# Patient Record
Sex: Male | Born: 1975
Health system: Southern US, Community
[De-identification: ages and names within clinical notes are randomized; demographics above are authoritative.]

## PROBLEM LIST (undated history)

## (undated) DIAGNOSIS — F419 Anxiety disorder, unspecified: Secondary | ICD-10-CM

## (undated) DIAGNOSIS — J189 Pneumonia, unspecified organism: Secondary | ICD-10-CM

## (undated) DIAGNOSIS — F329 Major depressive disorder, single episode, unspecified: Secondary | ICD-10-CM

## (undated) DIAGNOSIS — A6000 Herpesviral infection of urogenital system, unspecified: Secondary | ICD-10-CM

## (undated) DIAGNOSIS — T7840XA Allergy, unspecified, initial encounter: Secondary | ICD-10-CM

## (undated) DIAGNOSIS — B001 Herpesviral vesicular dermatitis: Secondary | ICD-10-CM

## (undated) DIAGNOSIS — K219 Gastro-esophageal reflux disease without esophagitis: Secondary | ICD-10-CM

## (undated) DIAGNOSIS — K649 Unspecified hemorrhoids: Secondary | ICD-10-CM

## (undated) HISTORY — DX: Gastro-esophageal reflux disease without esophagitis: K21.9

## (undated) HISTORY — PX: LACERATION REPAIR: SHX5168

## (undated) HISTORY — DX: Allergy, unspecified, initial encounter: T78.40XA

## (undated) HISTORY — DX: Major depressive disorder, single episode, unspecified: F32.9

## (undated) HISTORY — DX: Herpesviral infection of urogenital system, unspecified: A60.00

## (undated) HISTORY — DX: Unspecified hemorrhoids: K64.9

## (undated) HISTORY — DX: Herpesviral vesicular dermatitis: B00.1

## (undated) HISTORY — DX: Anxiety disorder, unspecified: F41.9

---

## 2011-02-01 ENCOUNTER — Emergency Department (HOSPITAL_COMMUNITY): Payer: 59

## 2011-02-01 ENCOUNTER — Emergency Department (HOSPITAL_COMMUNITY)
Admission: EM | Admit: 2011-02-01 | Discharge: 2011-02-01 | Disposition: A | Discharge status: Home / Self Care | Payer: 59 | Attending: Emergency Medicine | Admitting: Emergency Medicine

## 2011-02-01 DIAGNOSIS — F17200 Nicotine dependence, unspecified, uncomplicated: Secondary | ICD-10-CM | POA: Insufficient documentation

## 2011-02-01 DIAGNOSIS — R6889 Other general symptoms and signs: Secondary | ICD-10-CM | POA: Insufficient documentation

## 2011-02-01 DIAGNOSIS — R0789 Other chest pain: Secondary | ICD-10-CM | POA: Insufficient documentation

## 2011-02-01 DIAGNOSIS — J309 Allergic rhinitis, unspecified: Secondary | ICD-10-CM | POA: Insufficient documentation

## 2011-02-01 LAB — DIFFERENTIAL
Eosinophils Absolute: 0 10*3/uL (ref 0.0–0.7)
Eosinophils Relative: 1 % (ref 0–5)
Lymphocytes Relative: 32 % (ref 12–46)
Lymphs Abs: 1.8 10*3/uL (ref 0.7–4.0)
Monocytes Relative: 10 % (ref 3–12)

## 2011-02-01 LAB — CBC
HCT: 41.5 % (ref 39.0–52.0)
MCH: 28.3 pg (ref 26.0–34.0)
MCV: 80 fL (ref 78.0–100.0)
RBC: 5.19 MIL/uL (ref 4.22–5.81)
WBC: 5.5 10*3/uL (ref 4.0–10.5)

## 2011-02-01 LAB — BASIC METABOLIC PANEL
BUN: 13 mg/dL (ref 6–23)
CO2: 31 mEq/L (ref 19–32)
Calcium: 9.5 mg/dL (ref 8.4–10.5)
Chloride: 104 mEq/L (ref 96–112)
Creatinine, Ser: 0.85 mg/dL (ref 0.50–1.35)

## 2011-02-01 LAB — POCT I-STAT TROPONIN I: Troponin i, poc: 0 ng/mL (ref 0.00–0.08)

## 2011-05-24 HISTORY — PX: COLONOSCOPY: SHX174

## 2011-06-01 ENCOUNTER — Other Ambulatory Visit: Payer: Self-pay | Admitting: Family Medicine

## 2011-06-01 ENCOUNTER — Ambulatory Visit
Admission: RE | Admit: 2011-06-01 | Discharge: 2011-06-01 | Disposition: A | Discharge status: Home / Self Care | Payer: 59 | Source: Ambulatory Visit | Attending: Family Medicine | Admitting: Family Medicine

## 2011-06-01 DIAGNOSIS — R05 Cough: Secondary | ICD-10-CM

## 2012-01-16 ENCOUNTER — Emergency Department (HOSPITAL_COMMUNITY): Payer: No Typology Code available for payment source

## 2012-01-16 ENCOUNTER — Emergency Department (HOSPITAL_COMMUNITY)
Admission: EM | Admit: 2012-01-16 | Discharge: 2012-01-16 | Disposition: A | Discharge status: Home / Self Care | Payer: No Typology Code available for payment source | Attending: Emergency Medicine | Admitting: Emergency Medicine

## 2012-01-16 ENCOUNTER — Encounter (HOSPITAL_COMMUNITY): Payer: Self-pay | Admitting: Emergency Medicine

## 2012-01-16 DIAGNOSIS — Z043 Encounter for examination and observation following other accident: Secondary | ICD-10-CM | POA: Insufficient documentation

## 2012-01-16 DIAGNOSIS — F17200 Nicotine dependence, unspecified, uncomplicated: Secondary | ICD-10-CM | POA: Insufficient documentation

## 2012-01-16 DIAGNOSIS — M549 Dorsalgia, unspecified: Secondary | ICD-10-CM | POA: Insufficient documentation

## 2012-01-16 MED ORDER — OXYCODONE-ACETAMINOPHEN 5-325 MG PO TABS
1.0000 | ORAL_TABLET | Freq: Once | ORAL | Status: AC
  Administered 2012-01-16: 1 via ORAL
  Filled 2012-01-16: qty 1

## 2012-01-16 MED ORDER — OXYCODONE-ACETAMINOPHEN 5-325 MG PO TABS: 1.0000 | ORAL_TABLET | Freq: Once | ORAL | Status: AC

## 2012-01-16 NOTE — ED Notes (Signed)
Patient transported to X-ray 
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
## 2012-01-16 NOTE — ED Notes (Signed)
MD at bedside. 

## 2012-11-02 ENCOUNTER — Emergency Department (HOSPITAL_COMMUNITY): Payer: BC Managed Care – PPO

## 2012-11-02 ENCOUNTER — Emergency Department (HOSPITAL_COMMUNITY)
Admission: EM | Admit: 2012-11-02 | Discharge: 2012-11-02 | Disposition: A | Discharge status: Home / Self Care | Payer: BC Managed Care – PPO | Attending: Emergency Medicine | Admitting: Emergency Medicine

## 2012-11-02 ENCOUNTER — Encounter (HOSPITAL_COMMUNITY): Payer: Self-pay | Admitting: Emergency Medicine

## 2012-11-02 DIAGNOSIS — Y9389 Activity, other specified: Secondary | ICD-10-CM | POA: Insufficient documentation

## 2012-11-02 DIAGNOSIS — S0990XA Unspecified injury of head, initial encounter: Secondary | ICD-10-CM | POA: Insufficient documentation

## 2012-11-02 DIAGNOSIS — S298XXA Other specified injuries of thorax, initial encounter: Secondary | ICD-10-CM | POA: Insufficient documentation

## 2012-11-02 DIAGNOSIS — S02113A Unspecified occipital condyle fracture, initial encounter for closed fracture: Secondary | ICD-10-CM

## 2012-11-02 DIAGNOSIS — S02109A Fracture of base of skull, unspecified side, initial encounter for closed fracture: Secondary | ICD-10-CM | POA: Insufficient documentation

## 2012-11-02 DIAGNOSIS — Y92410 Unspecified street and highway as the place of occurrence of the external cause: Secondary | ICD-10-CM | POA: Insufficient documentation

## 2012-11-02 DIAGNOSIS — S12000A Unspecified displaced fracture of first cervical vertebra, initial encounter for closed fracture: Secondary | ICD-10-CM | POA: Insufficient documentation

## 2012-11-02 DIAGNOSIS — Z79899 Other long term (current) drug therapy: Secondary | ICD-10-CM | POA: Insufficient documentation

## 2012-11-02 DIAGNOSIS — R079 Chest pain, unspecified: Secondary | ICD-10-CM

## 2012-11-02 DIAGNOSIS — IMO0002 Reserved for concepts with insufficient information to code with codable children: Secondary | ICD-10-CM | POA: Insufficient documentation

## 2012-11-02 DIAGNOSIS — S0081XA Abrasion of other part of head, initial encounter: Secondary | ICD-10-CM

## 2012-11-02 MED ORDER — MUPIROCIN CALCIUM 2 % EX CREA: TOPICAL_CREAM | Freq: Three times a day (TID) | CUTANEOUS | Status: DC

## 2012-11-02 MED ORDER — MORPHINE SULFATE 4 MG/ML IJ SOLN
4.0000 mg | Freq: Once | INTRAMUSCULAR | Status: AC
  Administered 2012-11-02: 4 mg via INTRAVENOUS
  Filled 2012-11-02: qty 1

## 2012-11-02 MED ORDER — OXYCODONE-ACETAMINOPHEN 5-325 MG PO TABS: 1.0000 | ORAL_TABLET | ORAL | Status: DC | PRN

## 2012-11-02 MED ORDER — SODIUM CHLORIDE 0.9 % IV BOLUS (SEPSIS)
500.0000 mL | Freq: Once | INTRAVENOUS | Status: AC
  Administered 2012-11-02: 500 mL via INTRAVENOUS
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
## 2012-11-02 NOTE — ED Notes (Signed)
Pt c/o neck and lower chest pain.

## 2012-11-03 NOTE — ED Provider Notes (Signed)
Medical screening examination/treatment/procedure(s) were conducted as a shared visit with non-physician practitioner(s) and myself.  I personally evaluated the patient during the encounter  Doug Sou, MD 11/03/12 0100

## 2014-06-17 LAB — HM COLONOSCOPY

## 2014-06-24 LAB — HM COLONOSCOPY

## 2017-08-07 ENCOUNTER — Encounter: Payer: Self-pay | Admitting: Medical

## 2017-08-07 ENCOUNTER — Ambulatory Visit (INDEPENDENT_AMBULATORY_CARE_PROVIDER_SITE_OTHER): Payer: No Typology Code available for payment source | Admitting: Medical

## 2017-08-07 ENCOUNTER — Telehealth: Payer: Self-pay | Admitting: Medical

## 2017-08-07 VITALS — BP 128/78 | HR 66 | Temp 98.3°F | Ht 73.0 in | Wt 216.8 lb

## 2017-08-07 DIAGNOSIS — K582 Mixed irritable bowel syndrome: Secondary | ICD-10-CM

## 2017-08-07 DIAGNOSIS — F458 Other somatoform disorders: Secondary | ICD-10-CM | POA: Diagnosis not present

## 2017-08-07 DIAGNOSIS — K649 Unspecified hemorrhoids: Secondary | ICD-10-CM | POA: Diagnosis not present

## 2017-08-07 DIAGNOSIS — K219 Gastro-esophageal reflux disease without esophagitis: Secondary | ICD-10-CM

## 2017-08-07 DIAGNOSIS — G8929 Other chronic pain: Secondary | ICD-10-CM | POA: Diagnosis not present

## 2017-08-07 DIAGNOSIS — R109 Unspecified abdominal pain: Secondary | ICD-10-CM

## 2017-08-07 DIAGNOSIS — N529 Male erectile dysfunction, unspecified: Secondary | ICD-10-CM

## 2017-08-07 DIAGNOSIS — R0989 Other specified symptoms and signs involving the circulatory and respiratory systems: Secondary | ICD-10-CM

## 2017-08-07 MED ORDER — ELUXADOLINE 75 MG PO TABS: 1.0000 | ORAL_TABLET | Freq: Two times a day (BID) | ORAL | 0 refills | Status: DC

## 2017-08-07 MED ORDER — OMEPRAZOLE 40 MG PO CPDR: 40.0000 mg | DELAYED_RELEASE_CAPSULE | Freq: Every day | ORAL | 1 refills | Status: DC

## 2017-08-07 NOTE — Progress Notes (Signed)
Subjective: Chief Complaint  Patient presents with  . New Patient (Initial Visit)   Here as a new patient.  Was seeing [facility] prior.    He has several concerns today.  He has a history of erectile dysfunction, takes generic Viagra, prescribed through Dr. Tresa Moore at [facility].  His main concern is bowel issues.  Having some changes in bowels in the last 5- 6 months.  Sometimes goes to sit in bathroom and just a little comes out.  Has to go again in 30 minutes.  Feels constipated at times.   Sometimes goes 4-5 times daily.  Has always had stomach problems.  Drinks a pint of liquor weekly.  Gets a lot bloating and gas.  No prior food allergy testing.  Doesn't tolerate fresh pineapple.  Had EGD and colonoscopy in past 3-4 years ago.  Saw Dr. Paulita Fujita.  Had some colon polyps.    Sometimes "feels weird" in his anterior neck like he is swollen.  Sometimes feels weird in his chest.  Sometimes has sharp pain in his low back.  Has hemorrhoids.  Uses hemorrhoid cream.  In general is a [employer], [employer].  Unemployed since 04/27/18.  hasn't paid off the endoscopy bills.     GERD - at times takes OTC Prilosec. Has been on medication prior for GERD.  Felt like food was coming back up.   Felt sensation of blood in back of mouth.   No physical in 3 years.   His prior PCP at [facility] left.  Wants a physical   Has used Valtrex in past. No outbreaks in many years.   Has his kids every other weekend, and 2 days per week,   Past Medical History:  Diagnosis Date  . Anxiety    no doctor dx, states he feels it.  . Depression    no doctor dx, states he feels it.   Current Outpatient Medications on File Prior to Visit  Medication Sig Dispense Refill  . BLACK CURRANT SEED OIL PO Take 350 mg by mouth.    . sildenafil (REVATIO) 20 MG tablet Take 20 mg by mouth 3 (three) times daily.    Marland Kitchen albuterol (PROVENTIL HFA;VENTOLIN HFA) 108 (90 BASE) MCG/ACT inhaler Inhale 2 puffs into the lungs every 6 (six) hours as  needed for wheezing or shortness of breath.     . cephALEXin (KEFLEX) 500 MG capsule Take 500 mg by mouth 2 (two) times daily. For 10 days; Start date 10/29/12    . mupirocin cream (BACTROBAN) 2 % Apply topically 3 (three) times daily. To abrasions on face (Patient not taking: Reported on 08/07/2017) 15 g 0  . oxyCODONE-acetaminophen (PERCOCET/ROXICET) 5-325 MG per tablet Take 1-2 tablets by mouth every 4 (four) hours as needed for pain. (Patient not taking: Reported on 08/07/2017) 60 tablet 0   No current facility-administered medications on file prior to visit.    ROS as in subjective    Objective: BP 128/78 (BP Location: Right Arm, Patient Position: Sitting, Cuff Size: Normal)   Pulse 66   Temp 98.3 F (36.8 C) (Oral)   Ht 6\' 1"  (1.854 m)   Wt 216 lb 12.8 oz (98.3 kg)   SpO2 98%   BMI 28.60 kg/m   General appearance: alert, no distress, WD/WN, AA male HEENT: normocephalic, sclerae anicteric, TMs pearly, nares patent, no discharge or erythema, pharynx normal Oral cavity: MMM, no lesions Neck: supple, no lymphadenopathy, no thyromegaly, no masses Heart: RRR, normal S1, S2, no murmurs Lungs: CTA bilaterally,  no wheezes, rhonchi, or rales Abdomen: +bs, soft, non tender, non distended, no masses, no hepatomegaly, no splenomegaly Pulses: 2+ symmetric, upper and lower extremities, normal cap refill     Assessment: Encounter Diagnoses  Name Primary?  . Chronic abdominal pain Yes  . Globus sensation   . Gastroesophageal reflux disease without esophagitis   . Hemorrhoids, unspecified hemorrhoid type   . Erectile dysfunction, unspecified erectile dysfunction type   . Irritable bowel syndrome with both constipation and diarrhea     Plan: IBS, abdominal pain, globus sensation, GERD - Begin trial of Viberzi samples for IBS.   Begin back on PPI for GERD.  Keep a food diary, avoid GERD triggers.   Discussed symptoms and diagnosis of IBS.  Will request copy of prior EGD and  Colonoscopy.  ED - c/t Viagra prn.  [facility] was seen today for new patient (initial visit).  Diagnoses and all orders for this visit:  Chronic abdominal pain  Globus sensation  Gastroesophageal reflux disease without esophagitis  Hemorrhoids, unspecified hemorrhoid type  Erectile dysfunction, unspecified erectile dysfunction type  Irritable bowel syndrome with both constipation and diarrhea  Other orders -     Eluxadoline (VIBERZI) 75 MG TABS; Take 1 tablet by mouth 2 (two) times daily. -     omeprazole (PRILOSEC) 40 MG capsule; Take 1 capsule (40 mg total) by mouth daily.  f/u in 3-4 wk for physical

## 2017-08-07 NOTE — Telephone Encounter (Signed)
Patient called because he states that he gave the wrong pharmacy location  He wants to use [facility], (I updated in chart) He states that you were sending in RX for reflux medication But I do not see that anything has been sent in

## 2017-08-16 ENCOUNTER — Telehealth: Payer: Self-pay | Admitting: Medical

## 2017-08-16 NOTE — Telephone Encounter (Signed)
Requested records received from Dr. Paulita Fujita at [facility]. Sending back for review.

## 2017-08-18 ENCOUNTER — Encounter: Payer: Self-pay | Admitting: Medical

## 2017-08-31 ENCOUNTER — Encounter: Payer: No Typology Code available for payment source | Admitting: Medical

## 2017-09-11 ENCOUNTER — Encounter: Payer: Self-pay | Admitting: Medical

## 2017-12-12 ENCOUNTER — Other Ambulatory Visit: Payer: Self-pay

## 2017-12-12 ENCOUNTER — Ambulatory Visit (INDEPENDENT_AMBULATORY_CARE_PROVIDER_SITE_OTHER): Payer: Self-pay | Admitting: Medical

## 2017-12-12 VITALS — BP 110/70 | HR 56 | Temp 97.6°F | Resp 16 | Ht 74.0 in | Wt 207.0 lb

## 2017-12-12 DIAGNOSIS — K582 Mixed irritable bowel syndrome: Secondary | ICD-10-CM

## 2017-12-12 DIAGNOSIS — K219 Gastro-esophageal reflux disease without esophagitis: Secondary | ICD-10-CM

## 2017-12-12 DIAGNOSIS — B001 Herpesviral vesicular dermatitis: Secondary | ICD-10-CM

## 2017-12-12 DIAGNOSIS — N529 Male erectile dysfunction, unspecified: Secondary | ICD-10-CM

## 2017-12-12 MED ORDER — VALACYCLOVIR HCL 500 MG PO TABS: 500.0000 mg | ORAL_TABLET | Freq: Every day | ORAL | 5 refills | Status: DC

## 2017-12-12 MED ORDER — OMEPRAZOLE 40 MG PO CPDR: 40.0000 mg | DELAYED_RELEASE_CAPSULE | Freq: Every day | ORAL | 1 refills | Status: DC

## 2017-12-12 MED ORDER — SILDENAFIL CITRATE 20 MG PO TABS: ORAL_TABLET | ORAL | 2 refills | Status: DC

## 2017-12-12 NOTE — Progress Notes (Signed)
Subjective: Chief Complaint  Patient presents with  . rx refill    pt needs refill, no lproblems with IBS since cutting out red meat,    Needs refill on Valtrex generic.    Has hx/o genital or oral herpetic lesions.  Has had several lesions in recent months.  Has used Valtrex in the past with good response.  Wants prescription for this.   No prior treatment with Acyclovir.     Needs refills on generic Viagra which he takes good response.  Has been on this  years through years  urology would like a refill today to me instead.  Has no side effects with this  Recently has not had much issues in the way of bowels.  In recent months he cut out red meat.  cut out tobacco and marijuana.  Is vaping some.     Past Medical History:  Diagnosis Date  . Anxiety    no doctor dx, states he feels it.  . Depression    no doctor dx, states he feels it.   Current Outpatient Medications on File Prior to Visit  Medication Sig Dispense Refill  . omeprazole (PRILOSEC) 40 MG capsule Take 1 capsule (40 mg total) by mouth daily. 30 capsule 1  . sildenafil (REVATIO) 20 MG tablet Take 20 mg by mouth 3 (three) times daily.    . mupirocin cream (BACTROBAN) 2 % Apply topically 3 (three) times daily. To abrasions on face (Patient not taking: Reported on 08/07/2017) 15 g 0   No current facility-administered medications on file prior to visit.    ROS as in subjective    Objective: BP 110/70   Pulse (!) 56   Temp 97.6 F (36.4 C) (Oral)   Resp 16   Ht 6\' 2"  (1.88 m)   Wt 207 lb (93.9 kg)   SpO2 98%   BMI 26.58 kg/m   General appearance: alert, no distress, WD/WN, AA male Left upper lip with mid stage healing cold sores, multiple otherwise HENT unremarkable    Assessment: Encounter Diagnoses  Name Primary?  . Recurrent cold sores Yes  . Erectile dysfunction, unspecified erectile dysfunction type   . Irritable bowel syndrome with both constipation and diarrhea   . Gastroesophageal reflux disease  without esophagitis     Plan: Recurrent cold sores-begin Viagra for prevention daily, discussed proper use, discussed dosage for flareups.  Discussed hygiene, prevention of spread  ED-does fine on generic Viagra, refill this today, discussed proper use, possible complications  IBS and GERD-no recent problems, controlled with medication and diet.  I reviewed his 2016 EGD and colonoscopy report he will be due for repeat colonoscopy in 2021  Wyeth was seen today for rx refill.  Diagnoses and all orders for this visit:  Recurrent cold sores  Erectile dysfunction, unspecified erectile dysfunction type  Irritable bowel syndrome with both constipation and diarrhea  Gastroesophageal reflux disease without esophagitis  Other orders -     valACYclovir (VALTREX) 500 MG tablet; Take 1 tablet (500 mg total) by mouth daily. Daily for suppression, use 4 tablets BID x 1 day for flare up -     sildenafil (REVATIO) 20 MG tablet; 2 tablets (40 mg) prior to sexual activity daily prn     f/u with call report in a few weeks.

## 2018-02-20 ENCOUNTER — Encounter (HOSPITAL_COMMUNITY): Payer: Self-pay | Admitting: Emergency Medicine

## 2018-02-20 ENCOUNTER — Other Ambulatory Visit: Payer: Self-pay

## 2018-02-20 ENCOUNTER — Ambulatory Visit (HOSPITAL_COMMUNITY)
Admission: EM | Admit: 2018-02-20 | Discharge: 2018-02-20 | Disposition: A | Discharge status: Home / Self Care | Payer: Self-pay | Attending: Family Medicine | Admitting: Family Medicine

## 2018-02-20 DIAGNOSIS — J40 Bronchitis, not specified as acute or chronic: Secondary | ICD-10-CM

## 2018-02-20 MED ORDER — HYDROCODONE-HOMATROPINE 5-1.5 MG/5ML PO SYRP: 5.0000 mL | ORAL_SOLUTION | Freq: Four times a day (QID) | ORAL | 0 refills | Status: DC | PRN

## 2018-02-20 MED ORDER — PREDNISONE 20 MG PO TABS: ORAL_TABLET | ORAL | 0 refills | Status: DC

## 2018-02-20 NOTE — Discharge Instructions (Addendum)
Please return if cough is not improving by Thursday

## 2018-02-20 NOTE — ED Triage Notes (Signed)
Pt reports a cough since Sunday but denies any nasal congestion or fever.

## 2018-02-20 NOTE — ED Provider Notes (Signed)
[facility]    CSN: [id number] Arrival date & time: 02/20/18  1825     History   Chief Complaint Chief Complaint  Patient presents with  . URI    HPI Tabb Croghan is a 42 y.o. male.    Pt reports a cough since Sunday but denies any nasal congestion.  He believes is been running a low-grade fever for the last 24 hours.  Patient has had reactive airways in the past but did not benefit from an inhaler.  Patient notes that he smokes 1 cigarette daily until late last week when he stopped.  He has used electronic cigarettes in the past but stopped a couple months ago.  He stopped taking any "maryjane" last May.  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  His cough is described as productive with some brown mucus periodically.  He has noted no wheezing.       Past Medical History:  Diagnosis Date  . Anxiety    no doctor dx, states he feels it.  . Depression    no doctor dx, states he feels it.    Patient Active Problem List   Diagnosis Date Noted  . Recurrent cold sores 12/12/2017  . Chronic abdominal pain 08/07/2017  . Globus sensation 08/07/2017  . Gastroesophageal reflux disease without esophagitis 08/07/2017  . Hemorrhoids 08/07/2017  . Erectile dysfunction 08/07/2017  . Irritable bowel syndrome with both constipation and diarrhea 08/07/2017    History reviewed. No pertinent surgical history.     Home Medications    Prior to Admission medications   Medication Sig Start Date End Date Taking? Authorizing Provider  sildenafil (REVATIO) 20 MG tablet 2 tablets (40 mg) prior to sexual activity daily prn 12/12/17  Yes Tysinger, Camelia Eng, PA-C  valACYclovir (VALTREX) 500 MG tablet Take 1 tablet (500 mg total) by mouth daily. Daily for suppression, use 4 tablets BID x 1 day for flare up 12/12/17  Yes Tysinger, Camelia Eng, PA-C  HYDROcodone-homatropine (HYDROMET) 5-1.5 MG/5ML syrup Take 5 mLs by mouth every 6  (six) hours as needed for cough. 02/20/18   Robyn Haber, MD  omeprazole (PRILOSEC) 40 MG capsule Take 1 capsule (40 mg total) by mouth daily. 12/12/17   Tysinger, Camelia Eng, PA-C  predniSONE (DELTASONE) 20 MG tablet Two daily with food 02/20/18   Robyn Haber, MD  sildenafil (REVATIO) 20 MG tablet Take 20 mg by mouth 3 (three) times daily.    [provider]    Family History Family History  Problem Relation Age of Onset  . Cancer Mother   . Diabetes Father   . Cancer Father     Social History Social History   Tobacco Use  . Smoking status: Passive Smoke Exposure - Never Smoker  Substance Use Topics  . Alcohol use: Yes  . Drug use: Not on file     Allergies   Patient has no known allergies.   Review of Systems Review of Systems  Respiratory: Positive for cough.   All other systems reviewed and are negative.    Physical Exam Triage Vital Signs ED Triage Vitals  Enc Vitals Group     BP 02/20/18 1908 125/69     Pulse Rate 02/20/18 1908 84     Resp --      Temp 02/20/18 1908 99.2 F (37.3 C)     Temp Source 02/20/18 1908 Oral     SpO2 02/20/18 1908 100 %  Weight --      Height --      Head Circumference --      Peak Flow --      Pain Score 02/20/18 1909 6     Pain Loc --      Pain Edu? --      Excl. in [place]? --    No data found.  Updated Vital Signs BP 125/69 (BP Location: Left Arm)   Pulse 84   Temp 99.2 F (37.3 C) (Oral)   SpO2 100%    Physical Exam  Constitutional: He is oriented to person, place, and time. He appears well-developed and well-nourished.  HENT:  Head: Normocephalic.  Right Ear: External ear normal.  Left Ear: External ear normal.  Mouth/Throat: Oropharynx is clear and moist.  Eyes: Pupils are equal, round, and reactive to light. Conjunctivae are normal.  Neck: Normal range of motion. Neck supple.  Cardiovascular: Normal rate, regular rhythm and normal heart sounds.  Pulmonary/Chest: Effort normal and breath  sounds normal.  Musculoskeletal: Normal range of motion.  Neurological: He is alert and oriented to person, place, and time.  Skin: Skin is warm and dry.  Nursing note reviewed.    UC Treatments / Results  Labs (all labs ordered are listed, but only abnormal results are displayed) Labs Reviewed - No data to display  EKG None  Radiology No results found.  Procedures Procedures (including critical care time)  Medications Ordered in UC Medications - No data to display  Initial Impression / Assessment and Plan / UC Course  I have reviewed the triage vital signs and the nursing notes.  Pertinent labs & imaging results that were available during my care of the patient were reviewed by me and considered in my medical decision making (see chart for details).     Final Clinical Impressions(s) / UC Diagnoses   Final diagnoses:  Bronchitis     Discharge Instructions     Please return if cough is not improving by Thursday    ED Prescriptions    Medication Sig Dispense Auth. Provider   predniSONE (DELTASONE) 20 MG tablet Two daily with food 10 tablet Robyn Haber, MD   HYDROcodone-homatropine (HYDROMET) 5-1.5 MG/5ML syrup Take 5 mLs by mouth every 6 (six) hours as needed for cough. 60 mL Robyn Haber, MD     Controlled Substance Prescriptions  Controlled Substance Registry consulted? Not Applicable   Robyn Haber, MD 02/20/18 1943

## 2018-02-25 ENCOUNTER — Ambulatory Visit (INDEPENDENT_AMBULATORY_CARE_PROVIDER_SITE_OTHER): Payer: Self-pay

## 2018-02-25 ENCOUNTER — Ambulatory Visit (HOSPITAL_COMMUNITY)
Admission: EM | Admit: 2018-02-25 | Discharge: 2018-02-25 | Disposition: A | Discharge status: Home / Self Care | Payer: Self-pay | Attending: Internal Medicine | Admitting: Internal Medicine

## 2018-02-25 ENCOUNTER — Encounter (HOSPITAL_COMMUNITY): Payer: Self-pay

## 2018-02-25 DIAGNOSIS — J181 Lobar pneumonia, unspecified organism: Secondary | ICD-10-CM

## 2018-02-25 DIAGNOSIS — J189 Pneumonia, unspecified organism: Secondary | ICD-10-CM

## 2018-02-25 MED ORDER — ALBUTEROL SULFATE HFA 108 (90 BASE) MCG/ACT IN AERS: 1.0000 | INHALATION_SPRAY | Freq: Four times a day (QID) | RESPIRATORY_TRACT | 0 refills | Status: DC | PRN

## 2018-02-25 MED ORDER — AZITHROMYCIN 250 MG PO TABS: ORAL_TABLET | ORAL | 0 refills | Status: AC

## 2018-02-25 NOTE — Discharge Instructions (Signed)
Push fluids to ensure adequate hydration and keep secretions thin.  Tylenol and/or ibuprofen as needed for pain or fevers.  Complete course of antibiotics.  Inhaler as needed for cough or shortness of breath .  Continue to decrease smoking.  Please follow up with your primary care provider for recheck in 2-3 weeks.  Return sooner if any worsening of symptoms.

## 2018-02-25 NOTE — ED Triage Notes (Signed)
Pt presents for follow up visit following his visit on the 1st for a persistent cough; Pt states he has had no relief with prescribed and OTC medication.

## 2018-02-25 NOTE — ED Provider Notes (Signed)
[facility]    CSN: [id number] Arrival date & time: 02/25/18  1542     History   Chief Complaint Chief Complaint  Patient presents with  . Cough    HPI Bruce Wiley is a 42 y.o. male.   Bruce Wiley presents with complaints of worsening of cough and chest pain . Started a week ago, was seen here, provided prednisone and cough syrup which did not help. Congestion. No new fevers. Feels shortness of breath primarily at night. Recently has quit smoking, had been vaping and using cigars occasionally. Has had ill family members. No ear pain or sore throat. Has not been taking any other medications for symptoms. Hx of anxiety, depression.    ROS per HPI.      Past Medical History:  Diagnosis Date  . Anxiety    no doctor dx, states he feels it.  . Depression    no doctor dx, states he feels it.    Patient Active Problem List   Diagnosis Date Noted  . Recurrent cold sores 12/12/2017  . Chronic abdominal pain 08/07/2017  . Globus sensation 08/07/2017  . Gastroesophageal reflux disease without esophagitis 08/07/2017  . Hemorrhoids 08/07/2017  . Erectile dysfunction 08/07/2017  . Irritable bowel syndrome with both constipation and diarrhea 08/07/2017    History reviewed. No pertinent surgical history.     Home Medications    Prior to Admission medications   Medication Sig Start Date End Date Taking? Authorizing Provider  albuterol (PROVENTIL HFA;VENTOLIN HFA) 108 (90 Base) MCG/ACT inhaler Inhale 1-2 puffs into the lungs every 6 (six) hours as needed for wheezing or shortness of breath. 02/25/18   Zigmund Gottron, NP  azithromycin (ZITHROMAX) 250 MG tablet Take 2 tablets (500 mg total) by mouth daily for 1 day, THEN 1 tablet (250 mg total) daily for 4 days. 02/25/18 03/02/18  Zigmund Gottron, NP  omeprazole (PRILOSEC) 40 MG capsule Take 1 capsule (40 mg total) by mouth daily. 12/12/17   Tysinger, Camelia Eng, PA-C  sildenafil (REVATIO) 20 MG tablet Take 20 mg by  mouth 3 (three) times daily.    [provider]  sildenafil (REVATIO) 20 MG tablet 2 tablets (40 mg) prior to sexual activity daily prn 12/12/17   Tysinger, Camelia Eng, PA-C  valACYclovir (VALTREX) 500 MG tablet Take 1 tablet (500 mg total) by mouth daily. Daily for suppression, use 4 tablets BID x 1 day for flare up 12/12/17   Tysinger, Camelia Eng, PA-C    Family History Family History  Problem Relation Age of Onset  . Cancer Mother   . Diabetes Father   . Cancer Father     Social History Social History   Tobacco Use  . Smoking status: Passive Smoke Exposure - Never Smoker  Substance Use Topics  . Alcohol use: Yes  . Drug use: Not on file     Allergies   Patient has no known allergies.   Review of Systems Review of Systems   Physical Exam Triage Vital Signs ED Triage Vitals  Enc Vitals Group     BP 02/25/18 1558 120/73     Pulse Rate 02/25/18 1558 92     Resp 02/25/18 1558 20     Temp 02/25/18 1558 99 F (37.2 C)     Temp Source 02/25/18 1558 Temporal     SpO2 02/25/18 1558 97 %     Weight --      Height --      Head Circumference --  Peak Flow --      Pain Score 02/25/18 1559 5     Pain Loc --      Pain Edu? --      Excl. in [place]? --    No data found.  Updated Vital Signs BP 120/73 (BP Location: Right Arm)   Pulse 92   Temp 99 F (37.2 C) (Temporal)   Resp 20   SpO2 97%    Physical Exam  Constitutional: He is oriented to person, place, and time. He appears well-developed and well-nourished.  HENT:  Head: Normocephalic and atraumatic.  Right Ear: Tympanic membrane, external ear and ear canal normal.  Left Ear: Tympanic membrane, external ear and ear canal normal.  Nose: Nose normal. Right sinus exhibits no maxillary sinus tenderness and no frontal sinus tenderness. Left sinus exhibits no maxillary sinus tenderness and no frontal sinus tenderness.  Mouth/Throat: Uvula is midline, oropharynx is clear and moist and mucous membranes are normal.    Eyes: Pupils are equal, round, and reactive to light. Conjunctivae are normal.  Neck: Normal range of motion.  Cardiovascular: Normal rate and regular rhythm.  Pulmonary/Chest: Effort normal. He has decreased breath sounds in the right lower field and the left lower field.  Lymphadenopathy:    He has no cervical adenopathy.  Neurological: He is alert and oriented to person, place, and time.  Skin: Skin is warm and dry.  Vitals reviewed.    UC Treatments / Results  Labs (all labs ordered are listed, but only abnormal results are displayed) Labs Reviewed - No data to display  EKG None  Radiology Dg Chest 2 View  Result Date: 02/25/2018 CLINICAL DATA:  Cough and fatigue.  Body aches. EXAM: CHEST - 2 VIEW COMPARISON:  11/02/2012 FINDINGS: Right lung unremarkable. Airspace disease in the retro hilar left lower lobe is compatible with pneumonia. No edema or pleural effusion. The visualized bony structures of the thorax are intact. IMPRESSION: Patchy airspace disease in the left lung is compatible with pneumonia. Electronically Signed   By: Misty Stanley M.D.   On: 02/25/2018 17:03    Procedures Procedures (including critical care time)  Medications Ordered in UC Medications - No data to display  Initial Impression / Assessment and Plan / UC Course  I have reviewed the triage vital signs and the nursing notes.  Pertinent labs & imaging results that were available during my care of the patient were reviewed by me and considered in my medical decision making (see chart for details).     Xray concerning for pna. Consistent with history of illness. Course of azithromycin provided. Return precautions provided. If symptoms worsen or do not improve in the next week to return to be seen or to follow up with PCP.  Patient verbalized understanding and agreeable to plan.   Final Clinical Impressions(s) / UC Diagnoses   Final diagnoses:  Community acquired pneumonia of left lower lobe of  lung ([place])     Discharge Instructions     Push fluids to ensure adequate hydration and keep secretions thin.  Tylenol and/or ibuprofen as needed for pain or fevers.  Complete course of antibiotics.  Inhaler as needed for cough or shortness of breath .  Continue to decrease smoking.  Please follow up with your primary care provider for recheck in 2-3 weeks.  Return sooner if any worsening of symptoms.    ED Prescriptions    Medication Sig Dispense Auth. Provider   azithromycin (ZITHROMAX) 250 MG tablet Take 2 tablets (  500 mg total) by mouth daily for 1 day, THEN 1 tablet (250 mg total) daily for 4 days. 6 tablet Augusto Gamble B, NP   albuterol (PROVENTIL HFA;VENTOLIN HFA) 108 (90 Base) MCG/ACT inhaler Inhale 1-2 puffs into the lungs every 6 (six) hours as needed for wheezing or shortness of breath. 1 Inhaler Zigmund Gottron, NP     Controlled Substance Prescriptions [place] Controlled Substance Registry consulted? Not Applicable   Zigmund Gottron, NP 02/25/18 1858

## 2018-04-26 ENCOUNTER — Telehealth: Payer: Self-pay | Admitting: Medical

## 2018-04-26 NOTE — Telephone Encounter (Signed)
Dismissal letter in [employer]  °

## 2018-07-22 ENCOUNTER — Ambulatory Visit (HOSPITAL_COMMUNITY)
Admission: EM | Admit: 2018-07-22 | Discharge: 2018-07-22 | Disposition: A | Discharge status: Home / Self Care | Payer: Self-pay | Attending: Family Medicine | Admitting: Family Medicine

## 2018-07-22 ENCOUNTER — Encounter (HOSPITAL_COMMUNITY): Payer: Self-pay | Admitting: Emergency Medicine

## 2018-07-22 DIAGNOSIS — J0140 Acute pansinusitis, unspecified: Secondary | ICD-10-CM

## 2018-07-22 MED ORDER — PREDNISONE 50 MG PO TABS: 50.0000 mg | ORAL_TABLET | Freq: Every day | ORAL | 0 refills | Status: DC

## 2018-07-22 MED ORDER — DOXYCYCLINE HYCLATE 100 MG PO CAPS: 100.0000 mg | ORAL_CAPSULE | Freq: Two times a day (BID) | ORAL | 0 refills | Status: DC

## 2018-07-22 MED ORDER — IPRATROPIUM BROMIDE 0.06 % NA SOLN: 2.0000 | Freq: Four times a day (QID) | NASAL | 0 refills | Status: DC

## 2018-07-22 NOTE — Discharge Instructions (Signed)
Start doxycycline and prednisone as directed. Start atrovent nasal spray for nasal congestion/drainage. You can use over the counter nasal saline rinse such as neti pot for nasal congestion. Keep hydrated, your urine should be clear to pale yellow in color. Tylenol/motrin for fever and pain. Monitor for any worsening of symptoms, chest pain, shortness of breath, wheezing, swelling of the throat, follow up for reevaluation.

## 2018-07-22 NOTE — ED Triage Notes (Signed)
Pt c/o cough, cold symptoms x2 weerks, states the cough is worse in the evening. States hes having a lot of mucous.

## 2018-07-22 NOTE — ED Provider Notes (Signed)
[facility]    CSN: [id number] Arrival date & time: 07/22/18  1502     History   Chief Complaint Chief Complaint  Patient presents with  . Cough    HPI Sanay Belmar is a 43 y.o. male.   43 year old male comes in for 2 week history of URI symptoms. Has had cough, sore throat, rhinorrhea, nasal congestion. Denies fever, chills, night sweats. States that cough is improving during the day, but has worsened at night time. States now with mild voice hoarseness He denies abdominal pain, nausea, vomiting, diarrhea. States GERD well controlled. otc cold medicine without relief. Former smoker.     Past Medical History:  Diagnosis Date  . Anxiety    no doctor dx, states he feels it.  . Depression    no doctor dx, states he feels it.    Patient Active Problem List   Diagnosis Date Noted  . Recurrent cold sores 12/12/2017  . Chronic abdominal pain 08/07/2017  . Globus sensation 08/07/2017  . Gastroesophageal reflux disease without esophagitis 08/07/2017  . Hemorrhoids 08/07/2017  . Erectile dysfunction 08/07/2017  . Irritable bowel syndrome with both constipation and diarrhea 08/07/2017    History reviewed. No pertinent surgical history.     Home Medications    Prior to Admission medications   Medication Sig Start Date End Date Taking? Authorizing Provider  albuterol (PROVENTIL HFA;VENTOLIN HFA) 108 (90 Base) MCG/ACT inhaler Inhale 1-2 puffs into the lungs every 6 (six) hours as needed for wheezing or shortness of breath. 02/25/18   Zigmund Gottron, NP  doxycycline (VIBRAMYCIN) 100 MG capsule Take 1 capsule (100 mg total) by mouth 2 (two) times daily. 07/22/18   Tasia Catchings, Marvon [place]illingburg V, PA-C  ipratropium (ATROVENT) 0.06 % nasal spray Place 2 sprays into both nostrils 4 (four) times daily. 07/22/18   Tasia Catchings, Shreyansh Tiffany V, PA-C  omeprazole (PRILOSEC) 40 MG capsule Take 1 capsule (40 mg total) by mouth daily. 12/12/17   Tysinger, Camelia Eng, PA-C  predniSONE (DELTASONE) 50 MG tablet Take 1 tablet  (50 mg total) by mouth daily. 07/22/18   Tasia Catchings, Delno Blaisdell V, PA-C  sildenafil (REVATIO) 20 MG tablet Take 20 mg by mouth 3 (three) times daily.    [provider]  sildenafil (REVATIO) 20 MG tablet 2 tablets (40 mg) prior to sexual activity daily prn 12/12/17   Tysinger, Camelia Eng, PA-C  valACYclovir (VALTREX) 500 MG tablet Take 1 tablet (500 mg total) by mouth daily. Daily for suppression, use 4 tablets BID x 1 day for flare up 12/12/17   Tysinger, Camelia Eng, PA-C    Family History Family History  Problem Relation Age of Onset  . Cancer Mother   . Diabetes Father   . Cancer Father     Social History Social History   Tobacco Use  . Smoking status: Passive Smoke Exposure - Never Smoker  Substance Use Topics  . Alcohol use: Yes  . Drug use: Not on file     Allergies   Patient has no known allergies.   Review of Systems Review of Systems  Reason unable to perform ROS: See HPI as above.     Physical Exam Triage Vital Signs ED Triage Vitals  Enc Vitals Group     BP 07/22/18 1517 127/73     Pulse Rate 07/22/18 1517 80     Resp 07/22/18 1517 16     Temp 07/22/18 1517 98.2 F (36.8 C)     Temp Source 07/22/18 1517 Oral  SpO2 07/22/18 1517 97 %     Weight --      Height --      Head Circumference --      Peak Flow --      Pain Score 07/22/18 1522 0     Pain Loc --      Pain Edu? --      Excl. in [place]? --    No data found.  Updated Vital Signs BP 127/73 (BP Location: Left Arm)   Pulse 80   Temp 98.2 F (36.8 C) (Oral)   Resp 16   SpO2 97%   Physical Exam Constitutional:      General: He is not in acute distress.    Appearance: He is well-developed. He is not ill-appearing, toxic-appearing or diaphoretic.  HENT:     Head: Normocephalic and atraumatic.     Right Ear: Tympanic membrane, ear canal and external ear normal. Tympanic membrane is not erythematous or bulging.     Left Ear: Tympanic membrane, ear canal and external ear normal. Tympanic membrane is not  erythematous or bulging.     Nose: Nose normal.     Right Sinus: No maxillary sinus tenderness or frontal sinus tenderness.     Left Sinus: No maxillary sinus tenderness or frontal sinus tenderness.     Mouth/Throat:     Mouth: Mucous membranes are moist.     Pharynx: Oropharynx is clear. Uvula midline.  Eyes:     Conjunctiva/sclera: Conjunctivae normal.     Pupils: Pupils are equal, round, and reactive to light.  Neck:     Musculoskeletal: Normal range of motion and neck supple.  Cardiovascular:     Rate and Rhythm: Normal rate and regular rhythm.     Heart sounds: Normal heart sounds. No murmur. No friction rub. No gallop.   Pulmonary:     Effort: Pulmonary effort is normal. No accessory muscle usage, prolonged expiration, respiratory distress or retractions.     Breath sounds: Normal breath sounds. No stridor, decreased air movement or transmitted upper airway sounds. No decreased breath sounds, wheezing, rhonchi or rales.  Skin:    General: Skin is warm and dry.  Neurological:     Mental Status: He is alert and oriented to person, place, and time.      UC Treatments / Results  Labs (all labs ordered are listed, but only abnormal results are displayed) Labs Reviewed - No data to display  EKG None  Radiology No results found.  Procedures Procedures (including critical care time)  Medications Ordered in UC Medications - No data to display  Initial Impression / Assessment and Plan / UC Course  I have reviewed the triage vital signs and the nursing notes.  Pertinent labs & imaging results that were available during my care of the patient were reviewed by me and considered in my medical decision making (see chart for details).    Will start doxycycline and prednisone go cover for sinusitis, laryngitis. Other symptomatic treatment discussed. Discussed with patient GERD can also cause some of the symptoms, to monitor acid reflux symptoms. Return precautions given. Patient  expresses understanding and agrees to plan.  Final Clinical Impressions(s) / UC Diagnoses   Final diagnoses:  Acute non-recurrent pansinusitis    ED Prescriptions    Medication Sig Dispense Auth. Provider   doxycycline (VIBRAMYCIN) 100 MG capsule Take 1 capsule (100 mg total) by mouth 2 (two) times daily. 20 capsule Oronde Hallenbeck V, PA-C   ipratropium (ATROVENT) 0.06 %  nasal spray Place 2 sprays into both nostrils 4 (four) times daily. 15 mL Eisen Robenson V, PA-C   predniSONE (DELTASONE) 50 MG tablet Take 1 tablet (50 mg total) by mouth daily. 5 tablet Tobin Chad, PA-C 07/22/18 1620

## 2018-10-26 ENCOUNTER — Other Ambulatory Visit: Payer: Self-pay | Admitting: Medical

## 2019-04-07 ENCOUNTER — Other Ambulatory Visit: Payer: Self-pay | Admitting: Medical

## 2019-05-01 ENCOUNTER — Other Ambulatory Visit: Payer: Self-pay

## 2019-05-01 ENCOUNTER — Ambulatory Visit (HOSPITAL_COMMUNITY)
Admission: EM | Admit: 2019-05-01 | Discharge: 2019-05-01 | Disposition: A | Discharge status: Home / Self Care | Payer: 59 | Attending: Family Medicine | Admitting: Family Medicine

## 2019-05-01 DIAGNOSIS — M5442 Lumbago with sciatica, left side: Secondary | ICD-10-CM | POA: Diagnosis not present

## 2019-05-01 MED ORDER — MELOXICAM 7.5 MG PO TABS: 7.5000 mg | ORAL_TABLET | Freq: Every day | ORAL | 0 refills | Status: DC

## 2019-05-01 MED ORDER — PREDNISONE 10 MG (21) PO TBPK: ORAL_TABLET | Freq: Every day | ORAL | 0 refills | Status: DC

## 2019-05-01 MED ORDER — CYCLOBENZAPRINE HCL 5 MG PO TABS: 5.0000 mg | ORAL_TABLET | Freq: Every day | ORAL | 0 refills | Status: DC

## 2019-05-01 MED ORDER — OMEPRAZOLE 40 MG PO CPDR: 40.0000 mg | DELAYED_RELEASE_CAPSULE | Freq: Every day | ORAL | 0 refills | Status: DC

## 2019-05-01 NOTE — Discharge Instructions (Signed)
Light and regular activity as tolerated.  See exercises provided.  Heat application while active can help with muscle spasms.  Sleep with pillow under your knees.   Course of prednisone.  Muscle relaxer at night. May cause drowsiness. Please do not take if driving or drinking alcohol.   Meloxicam once steroid has been completed, as needed for pain. Don't take additional ibuprofen for aleve if taking this and take with food. If persistent symptoms please follow up with your primary care provider or sports medicine.

## 2019-05-01 NOTE — ED Provider Notes (Signed)
[place]    CSN: [id number] Arrival date & time: 05/01/19  1059      History   Chief Complaint Chief Complaint  Patient presents with  . Back Pain    HPI Bruce Wiley is a 43 y.o. male.   Vijay Lister presents with complaints of low back pain which started three days ago. He had been running and tripped, nearly fell, but caught himself. Approximately 20 minutes later noted the pain to left low back. Radiates to left anterior thigh. No numbness tingling or weakness. No saddle symptoms. No bowel or bladder symptoms. Has had back injuries in the past. Has taken ibuprofen and aleve which haven't helped, no medications today. With his job he does frequent lifting and bending, which does worsen his symptoms. Pain 6/10 in severity.     ROS per HPI, negative if not otherwise mentioned.      Past Medical History:  Diagnosis Date  . Anxiety    no doctor dx, states he feels it.  . Depression    no doctor dx, states he feels it.    Patient Active Problem List   Diagnosis Date Noted  . Recurrent cold sores 12/12/2017  . Chronic abdominal pain 08/07/2017  . Globus sensation 08/07/2017  . Gastroesophageal reflux disease without esophagitis 08/07/2017  . Hemorrhoids 08/07/2017  . Erectile dysfunction 08/07/2017  . Irritable bowel syndrome with both constipation and diarrhea 08/07/2017    No past surgical history on file.     Home Medications    Prior to Admission medications   Medication Sig Start Date End Date Taking? Authorizing Provider  albuterol (PROVENTIL HFA;VENTOLIN HFA) 108 (90 Base) MCG/ACT inhaler Inhale 1-2 puffs into the lungs every 6 (six) hours as needed for wheezing or shortness of breath. 02/25/18   Zigmund Gottron, NP  cyclobenzaprine (FLEXERIL) 5 MG tablet Take 1 tablet (5 mg total) by mouth at bedtime. 05/01/19   Zigmund Gottron, NP  doxycycline (VIBRAMYCIN) 100 MG capsule Take 1 capsule (100 mg total) by mouth 2 (two) times daily.  07/22/18   Tasia Catchings, Amy V, PA-C  ipratropium (ATROVENT) 0.06 % nasal spray Place 2 sprays into both nostrils 4 (four) times daily. 07/22/18   Tasia Catchings, Amy V, PA-C  meloxicam (MOBIC) 7.5 MG tablet Take 1 tablet (7.5 mg total) by mouth daily. May use as needed for pain after completion of your prednisone pack 05/01/19   Augusto Gamble B, NP  omeprazole (PRILOSEC) 40 MG capsule Take 1 capsule (40 mg total) by mouth daily. 05/01/19   Zigmund Gottron, NP  predniSONE (STERAPRED UNI-PAK 21 TAB) 10 MG (21) TBPK tablet Take by mouth daily. Per box instruction 05/01/19   Augusto Gamble B, NP  sildenafil (REVATIO) 20 MG tablet Take 20 mg by mouth 3 (three) times daily.    [provider]  sildenafil (REVATIO) 20 MG tablet 2 tablets (40 mg) prior to sexual activity daily prn 12/12/17   Tysinger, Camelia Eng, PA-C  valACYclovir (VALTREX) 500 MG tablet Take 1 tablet (500 mg total) by mouth daily. Daily for suppression, use 4 tablets BID x 1 day for flare up 12/12/17   Tysinger, Camelia Eng, PA-C    Family History Family History  Problem Relation Age of Onset  . Cancer Mother   . Diabetes Father   . Cancer Father     Social History Social History   Tobacco Use  . Smoking status: Passive Smoke Exposure - Never Smoker  Substance Use Topics  .  Alcohol use: Yes  . Drug use: Not on file     Allergies   Patient has no known allergies.   Review of Systems Review of Systems   Physical Exam Triage Vital Signs ED Triage Vitals  Enc Vitals Group     BP 05/01/19 1141 118/73     Pulse Rate 05/01/19 1141 (!) 58     Resp 05/01/19 1141 20     Temp 05/01/19 1141 98.4 F (36.9 C)     Temp Source 05/01/19 1141 Oral     SpO2 05/01/19 1141 100 %     Weight --      Height --      Head Circumference --      Peak Flow --      Pain Score 05/01/19 1142 6     Pain Loc --      Pain Edu? --      Excl. in [place]? --    No data found.  Updated Vital Signs BP 118/73 (BP Location: Right Arm)   Pulse (!) 58   Temp 98.4 F  (36.9 C) (Oral)   Resp 20   SpO2 100%   Physical Exam Constitutional:      Appearance: He is well-developed.  Cardiovascular:     Rate and Rhythm: Normal rate.  Pulmonary:     Effort: Pulmonary effort is normal.  Musculoskeletal:     Lumbar back: He exhibits tenderness and pain. He exhibits normal range of motion, no bony tenderness, no swelling and no deformity.     Comments: no step off or deformity to spinous processes; pain with transition from sit to lay and lay to sit; strength equal bilaterally; gross sensation intact to lower extremities; pain with left straight leg raise  Skin:    General: Skin is warm and dry.  Neurological:     Mental Status: He is alert and oriented to person, place, and time.      UC Treatments / Results  Labs (all labs ordered are listed, but only abnormal results are displayed) Labs Reviewed - No data to display  EKG   Radiology No results found.  Procedures Procedures (including critical care time)  Medications Ordered in UC Medications - No data to display  Initial Impression / Assessment and Plan / UC Course  I have reviewed the triage vital signs and the nursing notes.  Pertinent labs & imaging results that were available during my care of the patient were reviewed by me and considered in my medical decision making (see chart for details).     No red flag findings. Left low back strain with sciatica. Pain management and prevention discussed. Return precautions provided. Patient verbalized understanding and agreeable to plan.  Ambulatory out of clinic without difficulty.    Final Clinical Impressions(s) / UC Diagnoses   Final diagnoses:  Acute left-sided low back pain with left-sided sciatica     Discharge Instructions     Light and regular activity as tolerated.  See exercises provided.  Heat application while active can help with muscle spasms.  Sleep with pillow under your knees.   Course of prednisone.  Muscle  relaxer at night. May cause drowsiness. Please do not take if driving or drinking alcohol.   Meloxicam once steroid has been completed, as needed for pain. Don't take additional ibuprofen for aleve if taking this and take with food. If persistent symptoms please follow up with your primary care provider or sports medicine.     ED  Prescriptions    Medication Sig Dispense Auth. Provider   omeprazole (PRILOSEC) 40 MG capsule Take 1 capsule (40 mg total) by mouth daily. 30 capsule Augusto Gamble B, NP   predniSONE (STERAPRED UNI-PAK 21 TAB) 10 MG (21) TBPK tablet Take by mouth daily. Per box instruction 21 tablet Burky, Lanelle Bal B, NP   meloxicam (MOBIC) 7.5 MG tablet Take 1 tablet (7.5 mg total) by mouth daily. May use as needed for pain after completion of your prednisone pack 20 tablet Burky, Natalie B, NP   cyclobenzaprine (FLEXERIL) 5 MG tablet Take 1 tablet (5 mg total) by mouth at bedtime. 15 tablet Zigmund Gottron, NP     PDMP not reviewed this encounter.   Zigmund Gottron, NP 05/01/19 1241

## 2019-05-01 NOTE — ED Triage Notes (Signed)
Pt here for back pain onset 6 days... reports possibly pulling a muscle last week when he ran away from 2 pit bulls and fell and also lifting heavy objects at work  Pain increases w/activity ... needing note for work  A&O x4... no acute distress

## 2019-05-13 ENCOUNTER — Encounter: Payer: Self-pay | Admitting: Family Medicine

## 2019-05-13 ENCOUNTER — Ambulatory Visit: Payer: 59 | Admitting: Family Medicine

## 2019-05-13 ENCOUNTER — Other Ambulatory Visit: Payer: Self-pay

## 2019-05-13 VITALS — BP 112/74 | Ht 72.0 in | Wt 212.0 lb

## 2019-05-13 DIAGNOSIS — M545 Low back pain, unspecified: Secondary | ICD-10-CM | POA: Insufficient documentation

## 2019-05-13 MED ORDER — CYCLOBENZAPRINE HCL 10 MG PO TABS: 10.0000 mg | ORAL_TABLET | Freq: Three times a day (TID) | ORAL | 1 refills | Status: DC | PRN

## 2019-05-13 MED ORDER — DICLOFENAC SODIUM 75 MG PO TBEC: 75.0000 mg | DELAYED_RELEASE_TABLET | Freq: Two times a day (BID) | ORAL | 1 refills | Status: DC

## 2019-05-13 NOTE — Progress Notes (Signed)
    Subjective:  Bruce Wiley is a 43 y.o. male who presents to the [facility] today with a chief complaint of bilateral radiating back pain.   HPI: Patient with recent 3-1/2 weeks of radiating back pain from lumbar to mid thigh, started on left side but over the last week or so is now on right.  He denies red flag symptoms of incontinence, saddle anesthesia, IV drug use.  Says he did not actually fall as he was running from a dog but did lose his balance and twisted his back in a weird way.  He said it did not hurt immediately but within an hour or so was started to have pain.  He did go to urgent care few days later and there is no imaging from that event.  He was prescribed Mobic, Flexeril, course of prednisone and says he iha taken all those as prescribed.  Flexeril is the only one of those that seems to have helped.  Says he is worse with bending over or leaning back, has been sleeping in a recliner and has trouble walking around without pain.  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  Says the pain is described as burning and "pressure", denies paresthesia or loss of muscle strength.  Objective:  Physical Exam: BP 112/74   Ht 6' (1.829 m)   Wt 212 lb (96.2 kg)   BMI 28.75 kg/m   Gen: NAD, visibly uncomfortable moving around Pulm: NWOB, no cough   Back: Inspection: No edema or skin changes Palpation: No palpable deformity, no midline tenderness ROM: Pain restricting range of motion with flexion and extension of lower back, pain restricting extension of left knee and flexion of left hip on straight leg test Strength: 5+ bilaterally Stability: No instability Special tests: Pain in left lumbar radiating to left thigh on left-sided straight leg although not radiating past knee.  No pain to internal and external rotation of hip. Neurovascular: NVI distally  Bilateral hips: No deformity, instability. FROM with 5/5 strength. No tenderness to palpation. NVI distally. Negative  logroll.  Skin: warm, dry Neuro: grossly normal, moves all extremities Psych: Normal affect and thought content   Assessment/Plan:  Lumbar back pain Most likely lumbar strain given exam, radiation is not sciatic as it does not extend past the knee.  Mechanism of injury unlikely to cause fracture or nerve impingement.  Shared decision making with patient to pursue conservative treatment of physical therapy, change Mobic to diclofenac, may continue Flexeril if helpful.  Follow-up in 3 to 4 weeks.  Will write note for desk duty at work, patient may bring FMLA paperwork by office to be filled out or have his work faxed to [place].   Sherene Sires, DO FAMILY MEDICINE RESIDENT - PGY3 05/13/2019 11:54 AM

## 2019-05-13 NOTE — Assessment & Plan Note (Signed)
Most likely muscular strain given exam, radiation is not sciatic as it does not extend past the knee.  Mechanism of injury unlikely to cause fracture or nerve impingement.  Shared decision making with patient to pursue conservative treatment of physical therapy, change Mobic to diclofenac, may continue Flexeril if helpful.  Follow-up in 3 to 4 weeks.  Will write note for desk duty at work, patient may bring FMLA paperwork by office to be filled out or have his work faxed to [place].

## 2019-05-13 NOTE — Patient Instructions (Signed)
You have a lumbar strain, less likely a disc herniation of your low back. Both are treated similarly initially. Ok to take tylenol for baseline pain relief (1-2 extra strength tabs 3x/day) Take diclofenac twice a day with food for pain and inflammation - take for 7-10 days then as needed. Flexeril as needed for muscle spasms (no driving on this medicine if it makes you sleepy). Stay as active as possible. Physical therapy has been shown to be helpful as well - start this and do home exercises on days you don't go to therapy. Strengthening of low back muscles, abdominal musculature are key for long term pain relief. If not improving, will consider further imaging (MRI). Follow up with me in 3 weeks.

## 2019-05-14 ENCOUNTER — Encounter: Payer: Self-pay | Admitting: Family Medicine

## 2019-06-03 ENCOUNTER — Ambulatory Visit (INDEPENDENT_AMBULATORY_CARE_PROVIDER_SITE_OTHER): Payer: 59 | Admitting: Family Medicine

## 2019-06-03 ENCOUNTER — Encounter: Payer: Self-pay | Admitting: Family Medicine

## 2019-06-03 ENCOUNTER — Other Ambulatory Visit: Payer: Self-pay

## 2019-06-03 DIAGNOSIS — M545 Low back pain, unspecified: Secondary | ICD-10-CM

## 2019-06-03 NOTE — Patient Instructions (Signed)
You have a lumbar strain. Ok to take tylenol for baseline pain relief (1-2 extra strength tabs 3x/day) Take diclofenac twice a day with food for pain and inflammation - take like this for at least another week then try to take only once a day and eventually stop this. Flexeril as needed for muscle spasms (no driving on this medicine if it makes you sleepy). Stay as active as possible. Continue home exercises daily at least 4 more weeks. Physical therapy going forward is up to you at this point. Strengthening of low back muscles, abdominal musculature are key for long term pain relief. Follow up with me in 1 month or as needed.

## 2019-06-03 NOTE — Progress Notes (Signed)
   HPI  CC: Follow-up for lumbar strain  Patient is here for a follow-up from his December 21 visit for lumbar muscle strain he sustained after running from a dog.  Patient states that since that time his pain is improved.  He is currently not experiencing any pain on the left side of his lower back nor any pain down his legs, but does state that today he noticed some soreness in the right lumbar region.  It is mild.  He is currently still taking cyclobenzaprine 2-3 times a day and diclofenac orally 2 times a day.  Has not tried going many days without taking his medications.  He has gone to physical therapy 3 times so far.   Medications/Interventions Tried: Flexeril, diclofenac  See HPI and/or previous note for associated ROS.  Objective: BP 108/70   Ht 6' (1.829 m)   Wt 212 lb (96.2 kg)   BMI 28.75 kg/m  Gen:  NAD, well groomed, a/o x3, normal affect.  CV: Well-perfused. Warm.  Resp: Non-labored.  Neuro: Patellar tendon and Achilles reflex equal bilaterally.   MSK: No deformity.  Normal active and passive range of motion.  No TTP in the spinal paraspinal region.  Very mild TTP on right lumbar side.  None on left.  No pain with spinal flexion or extension.  5/5 strength in the lower extremities bilaterally.  Negative straight raise.  Negative faber, fadir.   Assessment and plan:  Lumbar back pain Much improved since last visit.  The initial left-sided pain is completely resolved for the last 3 to 4 days.  Today had mild soreness on the right lumbar region.  Strength, sensation, gait, coordination all normal. -Continue PT as needed.  You may transition to HEP when comfortable.  Continue to do daily home exercises, stretching for the next 4 weeks. -Continue muscle relaxer and diclofenac for next week, then begin to taper as able. -Follow-up 1 month or as needed.   Bruce Naegeli, MD, PGY 2 [facility] 06/03/2019 10:19 AM

## 2019-06-03 NOTE — Assessment & Plan Note (Signed)
Much improved since last visit.  The initial left-sided pain is completely resolved for the last 3 to 4 days.  Today had mild soreness on the right lumbar region.  Strength, sensation, gait, coordination all normal. -Continue PT as needed.  You may stop if you feel like it is not necessary more.  Continue to do daily home exercises, stretching for the next 4 weeks. -Continue muscle relaxer and pain medication for next week, then begin to taper as able. -Follow-up 1 month or as needed.

## 2019-07-26 ENCOUNTER — Other Ambulatory Visit: Payer: Self-pay | Admitting: Medical

## 2019-07-31 ENCOUNTER — Encounter: Payer: Self-pay | Admitting: Medical

## 2019-07-31 ENCOUNTER — Ambulatory Visit: Payer: 59 | Admitting: Medical

## 2019-07-31 ENCOUNTER — Other Ambulatory Visit: Payer: Self-pay

## 2019-07-31 VITALS — BP 110/78 | HR 61 | Temp 98.0°F | Ht 72.0 in | Wt 204.2 lb

## 2019-07-31 DIAGNOSIS — Z800 Family history of malignant neoplasm of digestive organs: Secondary | ICD-10-CM

## 2019-07-31 DIAGNOSIS — A6000 Herpesviral infection of urogenital system, unspecified: Secondary | ICD-10-CM | POA: Diagnosis not present

## 2019-07-31 DIAGNOSIS — K219 Gastro-esophageal reflux disease without esophagitis: Secondary | ICD-10-CM

## 2019-07-31 DIAGNOSIS — J358 Other chronic diseases of tonsils and adenoids: Secondary | ICD-10-CM | POA: Insufficient documentation

## 2019-07-31 DIAGNOSIS — I889 Nonspecific lymphadenitis, unspecified: Secondary | ICD-10-CM | POA: Insufficient documentation

## 2019-07-31 DIAGNOSIS — B001 Herpesviral vesicular dermatitis: Secondary | ICD-10-CM

## 2019-07-31 DIAGNOSIS — N529 Male erectile dysfunction, unspecified: Secondary | ICD-10-CM | POA: Diagnosis not present

## 2019-07-31 DIAGNOSIS — Z8042 Family history of malignant neoplasm of prostate: Secondary | ICD-10-CM

## 2019-07-31 MED ORDER — OMEPRAZOLE 40 MG PO CPDR: 40.0000 mg | DELAYED_RELEASE_CAPSULE | Freq: Every day | ORAL | 0 refills | Status: DC

## 2019-07-31 MED ORDER — VALACYCLOVIR HCL 500 MG PO TABS: 500.0000 mg | ORAL_TABLET | Freq: Every day | ORAL | 1 refills | Status: DC

## 2019-07-31 MED ORDER — SILDENAFIL CITRATE 50 MG PO TABS: 50.0000 mg | ORAL_TABLET | Freq: Every day | ORAL | 1 refills | Status: DC | PRN

## 2019-07-31 NOTE — Progress Notes (Signed)
Subjective Chief Complaint  Patient presents with  . Medication Management    with fasting labs   . Oral Swelling    white patches    Here today for med check and follow-up.  Last visit over a year ago.  Of note he has not had a physical in several years  GERD-uses omeprazole as needed not daily.  Would like a refill on this.  Avoids trigger foods.  Erectile dysfunction-does fine on sildenafil 50 mg.  Needs a refill on this.  No chest pain, no syncope, no palpitations, no other concern with this  He has a history of cold sores and genital herpes.  He has been using Valtrex as needed but in the past has used Valtrex daily for prevention.  He gets less than 5 outbreaks per year  He has a little white patch on his tonsil he wants looked at  ROS as in subjective   Objective: BP 110/78   Pulse 61   Temp 98 F (36.7 C)   Ht 6' (1.829 m)   Wt 204 lb 3.2 oz (92.6 kg)   SpO2 97%   BMI 27.69 kg/m   General appearence: alert, no distress, WD/WN, African-American male HEENT: normocephalic, sclerae anicteric, TMs pearly, nares patent, no discharge or erythema, pharynx with mild few little white patches of exudate on tonsil but no erythema no swelling no suggestion of infection Oral cavity: MMM, no lesions Neck: supple, shotty anterior tender nodes but no lymphadenopathy, no thyromegaly, no masses Heart: RRR, normal S1, S2, no murmurs Lungs: CTA bilaterally, no wheezes, rhonchi, or rales Pulses: 2+ symmetric, upper and lower extremities, normal cap refill      Assessment: Encounter Diagnoses  Name Primary?  . Genital herpes simplex, unspecified site Yes  . Cold sore   . Gastroesophageal reflux disease, unspecified whether esophagitis present   . Erectile dysfunction, unspecified erectile dysfunction type   . Family history of prostate cancer   . Family history of colon cancer   . Tonsillar exudate   . Lymphadenitis      Plan: History of cold sores, history of genital  herpes-we discussed acute therapy versus preventive therapy, discussed the need for preventative therapy based on relative frequency of outbreaks, we discussed prevention of spread to others, discussed condom use, discussed recognizing onset of symptoms and how long it usually takes for symptoms to resolve an acute flareup.  Continue Valtrex currently for acute flareup but if 1 or more flareup in the next 45 days particular gentle, can consider going back to preventative therapy.  Discussed risk and benefits of medication  GERD-discussed risk and benefits of PPI versus H2 blocker avoidance of triggers.  He is using omeprazole as needed  Erectile dysfunction-does fine on sildenafil 50 mg, refill today.  Discussed potential causes of ED.  He will return soon for fasting physical, EKG  Find out the ages of parents with their onset of cancer so we can help guide screening recommendations.  He has had both prostate and colon cancer screening prior.  His gastroenterologist called him recently about updated colonoscopy, [facility], Dr. Paulita Fujita.  He will touch base with them  We discussed the tonsillar finding which may be slight exudate without obvious infection.  We discussed the role of the lymphatic system in the body, discussed various white patches that we may sometimes see in the back of the throat.  He is not ill-appearing.  If lymph node not resolved within the next 2 weeks then we will  reevaluate when he comes back in for his physical soon  Bruce Wiley was seen today for medication management and oral swelling.  Diagnoses and all orders for this visit:  Genital herpes simplex, unspecified site  Cold sore  Gastroesophageal reflux disease, unspecified whether esophagitis present  Erectile dysfunction, unspecified erectile dysfunction type  Family history of prostate cancer  Family history of colon cancer  Tonsillar exudate  Lymphadenitis  Other orders -     omeprazole (PRILOSEC)  40 MG capsule; Take 1 capsule (40 mg total) by mouth daily. -     valACYclovir (VALTREX) 500 MG tablet; Take 1 tablet (500 mg total) by mouth daily. Daily for suppression, use 4 tablets BID x 1 day for flare up -     sildenafil (VIAGRA) 50 MG tablet; Take 1 tablet (50 mg total) by mouth daily as needed for erectile dysfunction.

## 2019-09-03 ENCOUNTER — Ambulatory Visit: Payer: 59 | Admitting: Medical

## 2019-09-03 ENCOUNTER — Encounter: Payer: Self-pay | Admitting: Medical

## 2019-09-03 ENCOUNTER — Other Ambulatory Visit: Payer: Self-pay

## 2019-09-03 VITALS — BP 114/70 | HR 52 | Temp 97.7°F | Ht 72.0 in | Wt 202.6 lb

## 2019-09-03 DIAGNOSIS — L989 Disorder of the skin and subcutaneous tissue, unspecified: Secondary | ICD-10-CM

## 2019-09-03 DIAGNOSIS — Z23 Encounter for immunization: Secondary | ICD-10-CM

## 2019-09-03 DIAGNOSIS — R270 Ataxia, unspecified: Secondary | ICD-10-CM | POA: Diagnosis not present

## 2019-09-03 DIAGNOSIS — K219 Gastro-esophageal reflux disease without esophagitis: Secondary | ICD-10-CM

## 2019-09-03 DIAGNOSIS — Z7189 Other specified counseling: Secondary | ICD-10-CM

## 2019-09-03 DIAGNOSIS — Z8042 Family history of malignant neoplasm of prostate: Secondary | ICD-10-CM

## 2019-09-03 DIAGNOSIS — Z13220 Encounter for screening for lipoid disorders: Secondary | ICD-10-CM

## 2019-09-03 DIAGNOSIS — Z800 Family history of malignant neoplasm of digestive organs: Secondary | ICD-10-CM

## 2019-09-03 DIAGNOSIS — Z7185 Encounter for immunization safety counseling: Secondary | ICD-10-CM | POA: Insufficient documentation

## 2019-09-03 DIAGNOSIS — Z125 Encounter for screening for malignant neoplasm of prostate: Secondary | ICD-10-CM | POA: Diagnosis not present

## 2019-09-03 DIAGNOSIS — Z0000 Encounter for general adult medical examination without abnormal findings: Secondary | ICD-10-CM | POA: Diagnosis not present

## 2019-09-03 DIAGNOSIS — Z1211 Encounter for screening for malignant neoplasm of colon: Secondary | ICD-10-CM

## 2019-09-03 DIAGNOSIS — A6000 Herpesviral infection of urogenital system, unspecified: Secondary | ICD-10-CM

## 2019-09-03 DIAGNOSIS — Z113 Encounter for screening for infections with a predominantly sexual mode of transmission: Secondary | ICD-10-CM

## 2019-09-03 DIAGNOSIS — B001 Herpesviral vesicular dermatitis: Secondary | ICD-10-CM

## 2019-09-03 MED ORDER — SILDENAFIL CITRATE 50 MG PO TABS: 50.0000 mg | ORAL_TABLET | Freq: Every day | ORAL | 11 refills | Status: DC | PRN

## 2019-09-03 MED ORDER — VALACYCLOVIR HCL 500 MG PO TABS: 500.0000 mg | ORAL_TABLET | Freq: Every day | ORAL | 3 refills | Status: DC

## 2019-09-03 MED ORDER — MUPIROCIN 2 % EX OINT: 1.0000 "application " | TOPICAL_OINTMENT | Freq: Three times a day (TID) | CUTANEOUS | 0 refills | Status: DC

## 2019-09-03 NOTE — Progress Notes (Addendum)
Subjective:   HPI  Bruce Wiley is a 44 y.o. male who presents for Chief Complaint  Patient presents with  . Annual Exam    with fasting labs     Patient Care Team: Mahdiya Mossberg, Camelia Eng, PA-C as PCP - General (Family Medicine) Sees dentist Sees eye doctor  Concerns: He has a history of cold sores and genital herpes.  He has been getting more frequent outbreaks.  Like to continue this for prevention at this point  Current sexual partner since February.  No symptoms.  Would like testing.  Every now and then he feels little off balance.  He is not sure if persistent will be concerned about.  He has 3 people in his family with multiple sclerosis  He has a history of colon cancer in mother, prostate cancer in father  He has a skin bump in the pubic area that he wants checked out as well as 1 on his back  Reviewed their medical, surgical, family, social, medication, and allergy history and updated chart as appropriate.  Past Medical History:  Diagnosis Date  . Genital herpes   . GERD (gastroesophageal reflux disease)   . Recurrent cold sores     Past Surgical History:  Procedure Laterality Date  . LACERATION REPAIR     left volar wrist    Social History   Socioeconomic History  . Marital status: Single    Spouse name: Not on file  . Number of children: Not on file  . Years of education: Not on file  . Highest education level: Not on file  Occupational History  . Not on file  Tobacco Use  . Smoking status: Current Some Day Smoker    Types: Cigars  . Smokeless tobacco: Never Used  Substance and Sexual Activity  . Alcohol use: Not Currently    Comment: some heavy alcohol in the past for 1 year  . Drug use: Yes    Types: Marijuana  . Sexual activity: Not on file  Other Topics Concern  . 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  08/2019   Social Determinants of Health   Financial  Resource Strain:   . Difficulty of Paying Living Expenses:   Food Insecurity:   . Worried About Charity fundraiser in the Last Year:   . Arboriculturist in the Last Year:   Transportation Needs:   . Film/video editor (Medical):   Marland Kitchen Lack of Transportation (Non-Medical):   Physical Activity:   . Days of Exercise per Week:   . Minutes of Exercise per Session:   Stress:   . Feeling of Stress :   Social Connections:   . Frequency of Communication with Friends and Family:   . Frequency of Social Gatherings with Friends and Family:   . Attends Religious Services:   . Active Member of Clubs or Organizations:   . Attends Archivist Meetings:   Marland Kitchen Marital Status:   Intimate Partner Violence:   . Fear of Current or Ex-Partner:   . Emotionally Abused:   Marland Kitchen Physically Abused:   . Sexually Abused:     Family History  Problem Relation Age of Onset  . Breast cancer Mother   . Colon cancer Mother 2  . Diabetes Father   . Cancer Father 34       prostate, brain  . Stroke Brother 75  .  Multiple sclerosis Brother   . Cancer Paternal Aunt        breast  . Multiple sclerosis Cousin   . Multiple sclerosis Cousin   . Heart disease Neg Hx      Current Outpatient Medications:  .  omeprazole (PRILOSEC) 40 MG capsule, Take 1 capsule (40 mg total) by mouth daily., Disp: 90 capsule, Rfl: 0 .  sildenafil (VIAGRA) 50 MG tablet, Take 1 tablet (50 mg total) by mouth daily as needed for erectile dysfunction., Disp: 30 tablet, Rfl: 11 .  valACYclovir (VALTREX) 500 MG tablet, Take 1 tablet (500 mg total) by mouth daily. Daily for suppression, use 4 tablets BID x 1 day for flare up, Disp: 100 tablet, Rfl: 3 .  albuterol (PROVENTIL HFA;VENTOLIN HFA) 108 (90 Base) MCG/ACT inhaler, Inhale 1-2 puffs into the lungs every 6 (six) hours as needed for wheezing or shortness of breath. (Patient not taking: Reported on 07/31/2019), Disp: 1 Inhaler, Rfl: 0 .  mupirocin ointment (BACTROBAN) 2 %, Apply 1  application topically 3 (three) times daily., Disp: 22 g, Rfl: 0  No Known Allergies     Review of Systems Constitutional: -fever, -chills, -sweats, -unexpected weight change, -decreased appetite, -fatigue Allergy: -sneezing, -itching, -congestion Dermatology: -changing moles, --rash, +lumps ENT: -runny nose, -ear pain, -sore throat, -hoarseness, -sinus pain, -teeth pain, - ringing in ears, -hearing loss, -nosebleeds Cardiology: -chest pain, -palpitations, -swelling, -difficulty breathing when lying flat, -waking up short of breath Respiratory: -cough, -shortness of breath, -difficulty breathing with exercise or exertion, -wheezing, -coughing up blood Gastroenterology: -abdominal pain, -nausea, -vomiting, -diarrhea, -constipation, -blood in stool, -changes in bowel movement, -difficulty swallowing or eating Hematology: -bleeding, -bruising  Musculoskeletal: -joint aches, -muscle aches, -joint swelling, -back pain, -neck pain, -cramping, -changes in gait Ophthalmology: denies vision changes, eye redness, itching, discharge Urology: -burning with urination, -difficulty urinating, -blood in urine, -urinary frequency, -urgency, -incontinence Neurology: -headache, -weakness, -tingling, -numbness, -memory loss, -falls, -dizziness Psychology: -depressed mood, -agitation, -sleep problems Male GU: no testicular mass, pain, no lymph nodes swollen, no swelling, no rash.     Objective:  BP 114/70   Pulse (!) 52   Temp 97.7 F (36.5 C)   Ht 6' (1.829 m)   Wt 202 lb 9.6 oz (91.9 kg)   SpO2 96%   BMI 27.48 kg/m   General appearance: alert, no distress, WD/WN, African American male Skin: 2 individual flat brown somewhat amorphous skin lesions on left upper abdomen and left lower abdomen consistent with birthmarks, raised pink 1.5 cm lesion in the left pubic region suggestive of inflamed hair follicle, small flat brown 4 mm diameter lesion on left upper back suggestive of prior sebaceous cyst  without any obvious cyst sac at this point, no other worrisome lesions Neck: supple, no lymphadenopathy, no thyromegaly, no masses, normal ROM, no bruits Chest: non tender, normal shape and expansion Heart: RRR, normal S1, S2, no murmurs Lungs: CTA bilaterally, no wheezes, rhonchi, or rales Abdomen: +bs, soft, non tender, non distended, no masses, no hepatomegaly, no splenomegaly, no bruits Back: non tender, normal ROM, no scoliosis Musculoskeletal: upper extremities non tender, no obvious deformity, normal ROM throughout, lower extremities non tender, no obvious deformity, normal ROM throughout Extremities: no edema, no cyanosis, no clubbing Pulses: 2+ symmetric, upper and lower extremities, normal cap refill Neurological: alert, oriented x 3, CN2-12 intact, strength normal upper extremities and lower extremities, sensation normal throughout, DTRs 2+ throughout, no cerebellar signs, gait normal Psychiatric: normal affect, behavior normal, pleasant  GU: normal male  external genitalia,circumcised, nontender, no masses, no hernia, no lymphadenopathy Rectal: declined   Assessment and Plan :   Encounter Diagnoses  Name Primary?  . Encounter for health maintenance examination in adult Yes  . Vaccine counseling   . Ataxia   . Screening for prostate cancer   . Screen for colon cancer   . Family history of colon cancer in mother   . Family history of prostate cancer in father   . Screening for lipid disorders   . Gastroesophageal reflux disease, unspecified whether esophagitis present   . Recurrent cold sores   . Genital herpes simplex, unspecified site   . Screen for STD (sexually transmitted disease)   . Skin lesion     Physical exam - discussed and counseled on healthy lifestyle, diet, exercise, preventative care, vaccinations, sick and well care, proper use of emergency dept and after hours care, and addressed their concerns.    Health screening: See your eye doctor yearly for  routine vision care. See your dentist yearly for routine dental care including hygiene visits twice yearly.  Discussed STD testing, discussed prevention, condom use, means of transmission  Cancer screening Advised monthly self testicular exam  Colonoscopy:  Given his family history of colon cancer if insurance will cover this I would recommend he go ahead and have a baseline colonoscopy  Discussed PSA, prostate exam, and prostate cancer screening risks/benefits.   Baseline PSA today given age, race and family history  Vaccinations: Advised yearly influenza vaccine Advise Covid vaccine  Counseled on the Tdap (tetanus, diptheria, and acellular pertussis) vaccine.  Vaccine information sheet given. Tdap vaccine given after consent obtained.   Acute issues discussed: Small inflamed hair follicle versus infected skin lesion in pubic area, begin mupirocin ointment topically and warm compresses.  If not resolved within the next 4 to 5 days let me know   Separate significant chronic issues discussed: Cold sores and genital herpes-change Valtrex to daily suppressive therapy.  Discussed proper use of medicine for suppression versus acute flareup  GERD-continue current medicine, avoid triggers  Ataxia-labs today, consider differential.  He has several members in the family with multiple sclerosis.  Consider neurology consult if symptoms continue or worsen    [place] was seen today for annual exam.  Diagnoses and all orders for this visit:  Encounter for health maintenance examination in adult -     Comprehensive metabolic panel -     CBC with Differential/Platelet -     Lipid panel -     TSH -     Vitamin B12 -     HIV Antibody (routine testing w rflx) -     RPR -     Chlamydia/Gonococcus/Trichomonas, NAA -     Hepatitis C antibody -     Hepatitis B surface antigen -     PSA -     Ambulatory referral to Gastroenterology  Vaccine counseling  Ataxia -     TSH -     Vitamin  B12  Screening for prostate cancer -     PSA  Screen for colon cancer -     Ambulatory referral to Gastroenterology  Family history of colon cancer in mother -     Ambulatory referral to Gastroenterology  Family history of prostate cancer in father -     PSA  Screening for lipid disorders  Gastroesophageal reflux disease, unspecified whether esophagitis present  Recurrent cold sores  Genital herpes simplex, unspecified site -     Hepatitis C  antibody -     Hepatitis B surface antigen  Screen for STD (sexually transmitted disease) -     HIV Antibody (routine testing w rflx) -     RPR -     Chlamydia/Gonococcus/Trichomonas, NAA -     Hepatitis C antibody -     Hepatitis B surface antigen  Skin lesion  Other orders -     mupirocin ointment (BACTROBAN) 2 %; Apply 1 application topically 3 (three) times daily. -     valACYclovir (VALTREX) 500 MG tablet; Take 1 tablet (500 mg total) by mouth daily. Daily for suppression, use 4 tablets BID x 1 day for flare up -     sildenafil (VIAGRA) 50 MG tablet; Take 1 tablet (50 mg total) by mouth daily as needed for erectile dysfunction.    Follow-up pending labs, yearly for physical

## 2019-09-03 NOTE — Addendum Note (Signed)
Addended by: Edgar Frisk on: 09/03/2019 02:25 PM   Modules accepted: Orders

## 2019-09-03 NOTE — Patient Instructions (Signed)
Preventative Care for Adults - Male    Thank you for coming in for your well visit today, and thank you for trusting [place] with your care!   Maintain regular health and wellness exams:  A routine yearly physical is a good way to check in with your primary care provider about your health and preventive screening. It is also an opportunity to share updates about your health and any concerns you have, and receive a thorough all-over exam.   Most health insurance companies pay for at least some preventative services.  Check with your health plan for specific coverages.  What preventative services do men need?  Adult men should have their weight and blood pressure checked regularly.   Men age 42 and older should have their cholesterol levels checked regularly.  Beginning at age 34 and continuing to age 43, men should be screened for colorectal cancer.  Certain people may need continued testing until age 78.  Updating vaccinations is part of preventative care.  Vaccinations help protect against diseases such as the flu.  Osteoporosis is a disease in which the bones lose minerals and strength as we age. Men ages 102 and over should discuss this with their caregivers  Lab tests are generally done as part of preventative care to screen for anemia and blood disorders, to screen for problems with the kidneys and liver, to screen for bladder problems, to check blood sugar, and to check your cholesterol level.  Preventative services generally include counseling about diet, exercise, avoiding tobacco, drugs, excessive alcohol consumption, and sexually transmitted infections.   Xrays and CT scans are not normally done as a preventative test, and most insurances do not pay for imaging for screening other than as discussed under cancer screens below.   On the other hand, if you have certain medical concerns, imaging may be necessary as a diagnostic test.    Your Medical Team Your medical team starts with  [place], your PCP or primary care provider.  Please use our services for your routine care such as physicals, screenings, immunizations, sick visits, and your first stop for general medical concerns.  You can call our number for after hours information for urgent questions that may need attention but cannot wait til the next business day.    Urgent care-urgent cares exist to provide care when your primary care office would typically be closed such as evenings or weekends.   Urgent care is for evaluation of urgent medical problems that do not necessarily require emergency department care, but cannot wait til the next business day when we are open.  Emergency department care-please reserve emergency department care for serious, urgent, possibly life-threatening medical problems.  This includes issues like possible stroke, heart attack, significant injury, mental health crisis, or other urgent need that requires immediate medical attention.     See your dentist office twice yearly for hygiene and cleaning visits.   Brush your teeth and floss your teeth daily.  See your eye doctor yearly for routine eye exam and screenings for glaucoma and retinal disease.   Specific Concerns: Use Valtrex 1 tablet daily for suppression.  Use 4 tablets twice daily for 1 day for flareup  I sent a prescription for Viagra sildenafil to your pharmacy.  Let me know if too expensive or if insurance not covering this  For the skin lesion in your pubic area, you can use some warm compresses 20 minutes twice daily and antibiotic ointment mupirocin I sent to your pharmacy for the  next several days  The skin lesion on your left upper back looks like a prior sebaceous cyst and requires no treatment or further evaluation at this time     Vaccines:  Stay up to date with your tetanus shots and other required immunizations. You should have a booster for tetanus every 10 years. Be sure to get your flu shot every year, since 5%-20% of  the [place] population comes down with the flu. The flu vaccine changes each year, so being vaccinated once is not enough. Get your shot in the fall, before the flu season peaks.   Other vaccines to consider:  Pneumococcal vaccine to protect against certain types of pneumonia.  This is normally recommended for adults age 30 or older.  However, adults younger than 42 years old with certain underlying conditions such as diabetes, heart or lung disease should also receive the vaccine.  Shingles vaccine to protect against Varicella Zoster if you are older than age 59, or younger than 42 years old with certain underlying illness.  If you have not had the Shingrix vaccine, please call your insurer to inquire about coverage for the Shingrix vaccine given in 2 doses.   Some insurers cover this vaccine after age 80, some cover this after age 50.  If your insurer covers this, then call to schedule appointment to have this vaccine here  Hepatitis A vaccine to protect against a form of infection of the liver by a virus acquired from food.  Hepatitis B vaccine to protect against a form of infection of the liver by a virus acquired from blood or body fluids, particularly if you work in health care.  If you plan to travel internationally, check with your local health department for specific vaccination recommendations.  Human Papilloma Virus or HPV causes cancer of the cervix, and other infections that can be transmitted from person to person. There is a vaccine for HPV, and males should get immunized between the ages of 61 and 23. It requires a series of 3 shots.   Covid/Coronavirus - as the vaccines are becoming available, please consider vaccination if you are a health care worker, first responder, or have significant health problems such as asthma, COPD, heart disease, hypertension, diabetes, obesity, multiple medical problems, over age 46yo, or immunocompromised.    We updated your tetanus diphtheria pertussis  vaccine today.  This is good for 10 years.     What should I know about Cancer screening? Many types of cancers can be detected early and may often be prevented. Lung Cancer  You should be screened every year for lung cancer if: ? You are a current smoker who has smoked for at least 30 years. ? You are a former smoker who has quit within the past 15 years.  Talk to your health care provider about your screening options, when you should start screening, and how often you should be screened.  Colorectal Cancer  Routine colorectal cancer screening usually begins at 42 years of age and should be repeated every 5-10 years until you are 42 years old. You may need to be screened more often if early forms of precancerous polyps or small growths are found. Your health care provider may recommend screening at an earlier age if you have risk factors for colon cancer.  Your health care provider may recommend using home test kits to check for hidden blood in the stool.  A small camera at the end of a tube can be used to examine your  colon (sigmoidoscopy or colonoscopy). This checks for the earliest forms of colorectal cancer.  Prostate and Testicular Cancer  Depending on your age and overall health, your health care provider may do certain tests to screen for prostate and testicular cancer.  Talk to your health care provider about any symptoms or concerns you have about testicular or prostate cancer.  Skin Cancer  Check your skin from head to toe regularly.  Tell your health care provider about any new moles or changes in moles, especially if: ? There is a change in a mole's size, shape, or color. ? You have a mole that is larger than a pencil eraser.  Always use sunscreen. Apply sunscreen liberally and repeat throughout the day.  Protect yourself by wearing long sleeves, pants, a wide-brimmed hat, and sunglasses when outside.     GENERAL RECOMMENDATIONS FOR GOOD HEALTH:  Healthy  diet:  Eat a variety of foods, including fruit, vegetables, animal or vegetable protein, such as meat, fish, chicken, and eggs, or beans, lentils, tofu, and grains, such as rice.  Drink plenty of water daily.  Decrease saturated fat in the diet, avoid lots of red meat, processed foods, sweets, fast foods, and fried foods.  Exercise:  Aerobic exercise helps maintain good heart health. At least 30-40 minutes of moderate-intensity exercise is recommended. For example, a brisk walk that increases your heart rate and breathing. This should be done on most days of the week.   Find a type of exercise or a variety of exercises that you enjoy so that it becomes a part of your daily life.  Examples are running, walking, swimming, water aerobics, and biking.  For motivation and support, explore group exercise such as aerobic class, spin class, Zumba, Yoga,or  martial arts, etc.    Set exercise goals for yourself, such as a certain weight goal, walk or run in a race such as a 5k walk/run.  Speak to your primary care provider about exercise goals.  Your weight readings per our records: Wt Readings from Last 3 Encounters:  09/03/19 202 lb 9.6 oz (91.9 kg)  07/31/19 204 lb 3.2 oz (92.6 kg)  06/03/19 212 lb (96.2 kg)    Body mass index is 27.48 kg/m.    Disease prevention:  If you smoke or chew tobacco, find out from your caregiver how to quit. It can literally save your life, no matter how long you have been a tobacco user. If you do not use tobacco, never begin.   Maintain a healthy diet and normal weight. Increased weight leads to problems with blood pressure and diabetes.   The Body Mass Index or BMI is a way of measuring how much of your body is fat. Having a BMI above 27 increases the risk of heart disease, diabetes, hypertension, stroke and other problems related to obesity. Your caregiver can help determine your BMI and based on it develop an exercise and dietary program to help you achieve  or maintain this important measurement at a healthful level.  High blood pressure causes heart and blood vessel problems.  Persistent high blood pressure should be treated with medicine if weight loss and exercise do not work.  Your blood pressure readings per our records:     BP Readings from Last 3 Encounters:  09/03/19 114/70  07/31/19 110/78  06/03/19 108/70     Fat and cholesterol leaves deposits in your arteries that can block them. This causes heart disease and vessel disease elsewhere in your body.  If  your cholesterol is found to be high, or if you have heart disease or certain other medical conditions, then you may need to have your cholesterol monitored frequently and be treated with medication.   Ask if you should have a cardiac stress test if your history suggests this. A stress test is a test done on a treadmill that looks for heart disease. This test can find disease prior to there being a problem.    Osteoporosis is a disease in which the bones lose minerals and strength as we age. This can result in serious bone fractures. Risk of osteoporosis can be identified using a bone density scan. Men ages 52 and over should discuss this with their caregivers. Ask your caregiver whether you should be taking a calcium supplement and Vitamin D, to reduce the rate of osteoporosis.   Avoid drinking alcohol in excess (more than two drinks per day).  Avoid use of street drugs. Do not share needles with anyone. Ask for professional help if you need assistance or instructions on stopping the use of alcohol, cigarettes, and/or drugs.  Brush your teeth twice a day with fluoride toothpaste, and floss once a day. Good oral hygiene prevents tooth decay and gum disease. The problems can be painful, unattractive, and can cause other health problems. Visit your dentist for a routine oral and dental check up and preventive care every 6-12 months.   Safety:  Use seatbelts 100% of the time, whether  driving or as a passenger.  Use safety devices such as hearing protection if you work in environments with loud noise or significant background noise.  Use safety glasses when doing any work that could send debris in to the eyes.  Use a helmet if you ride a bike or motorcycle.  Use appropriate safety gear for contact sports.  Talk to your caregiver about gun safety.  Use sunscreen with a SPF (or skin protection factor) of 15 or greater.  Lighter skinned people are at a greater risk of skin cancer. Don't forget to also wear sunglasses in order to protect your eyes from too much damaging sunlight. Damaging sunlight can accelerate cataract formation.   Keep carbon monoxide and smoke detectors in your home functioning at all times. Change the batteries every 6 months or use a model that plugs into the wall.    Sexual activity: . Sex is a normal part of life and sexual activity can continue into older adulthood for many healthy people.   . If you are having erectile dysfunction issues, please follow up to discuss this further.   . If you are not in a monogamous relationship or have more than one partner, please practice safe sex.  Use condoms. Condoms are used for birth control and to help reduce the spread of sexually transmitted infections (or STIs).  Some of the STIs are gonorrhea (the clap), chlamydia, syphilis, trichomonas, herpes, HPV (human papilloma virus) and HIV (human immunodeficiency virus) which causes AIDS. The herpes, HIV and HPV are viral illnesses that have no cure. These can result in disability, cancer and death.   We are able to test for STIs here at our office.

## 2019-09-04 LAB — COMPREHENSIVE METABOLIC PANEL
ALT: 15 IU/L (ref 0–44)
AST: 21 IU/L (ref 0–40)
Albumin/Globulin Ratio: 1.8 (ref 1.2–2.2)
Albumin: 4.6 g/dL (ref 4.0–5.0)
Alkaline Phosphatase: 49 IU/L (ref 39–117)
BUN/Creatinine Ratio: 14 (ref 9–20)
BUN: 12 mg/dL (ref 6–24)
Bilirubin Total: 0.3 mg/dL (ref 0.0–1.2)
CO2: 27 mmol/L (ref 20–29)
Calcium: 9.4 mg/dL (ref 8.7–10.2)
Chloride: 105 mmol/L (ref 96–106)
Creatinine, Ser: 0.88 mg/dL (ref 0.76–1.27)
GFR calc Af Amer: 123 mL/min/{1.73_m2} (ref >59)
GFR calc non Af Amer: 107 mL/min/{1.73_m2} (ref >59)
Globulin, Total: 2.5 g/dL (ref 1.5–4.5)
Glucose: 99 mg/dL (ref 65–99)
Potassium: 4.2 mmol/L (ref 3.5–5.2)
Sodium: 143 mmol/L (ref 134–144)
Total Protein: 7.1 g/dL (ref 6.0–8.5)

## 2019-09-04 LAB — LIPID PANEL
Chol/HDL Ratio: 4.1 ratio (ref 0.0–5.0)
Cholesterol, Total: 202 mg/dL — ABNORMAL HIGH (ref 100–199)
HDL: 49 mg/dL (ref >39)
LDL Chol Calc (NIH): 138 mg/dL — ABNORMAL HIGH (ref 0–99)
Triglycerides: 82 mg/dL (ref 0–149)
VLDL Cholesterol Cal: 15 mg/dL (ref 5–40)

## 2019-09-04 LAB — CBC WITH DIFFERENTIAL/PLATELET
Basophils Absolute: 0 10*3/uL (ref 0.0–0.2)
Basos: 1 %
EOS (ABSOLUTE): 0.1 10*3/uL (ref 0.0–0.4)
Eos: 1 %
Hematocrit: 44.9 % (ref 37.5–51.0)
Hemoglobin: 15.2 g/dL (ref 13.0–17.7)
Immature Grans (Abs): 0 10*3/uL (ref 0.0–0.1)
Immature Granulocytes: 0 %
Lymphocytes Absolute: 1.4 10*3/uL (ref 0.7–3.1)
Lymphs: 27 %
MCH: 28.5 pg (ref 26.6–33.0)
MCHC: 33.9 g/dL (ref 31.5–35.7)
MCV: 84 fL (ref 79–97)
Monocytes Absolute: 0.5 10*3/uL (ref 0.1–0.9)
Monocytes: 10 %
Neutrophils Absolute: 3.3 10*3/uL (ref 1.4–7.0)
Neutrophils: 61 %
Platelets: 219 10*3/uL (ref 150–450)
RBC: 5.34 x10E6/uL (ref 4.14–5.80)
RDW: 13 % (ref 11.6–15.4)
WBC: 5.4 10*3/uL (ref 3.4–10.8)

## 2019-09-04 LAB — PSA: Prostate Specific Ag, Serum: 1.2 ng/mL (ref 0.0–4.0)

## 2019-09-04 LAB — HEPATITIS C ANTIBODY: Hep C Virus Ab: <0.1 s/co ratio (ref 0.0–0.9)

## 2019-09-04 LAB — HEPATITIS B SURFACE ANTIGEN: Hepatitis B Surface Ag: NEGATIVE

## 2019-09-04 LAB — VITAMIN B12: Vitamin B-12: 599 pg/mL (ref 232–1245)

## 2019-09-04 LAB — TSH: TSH: 1.55 u[IU]/mL (ref 0.450–4.500)

## 2019-09-04 LAB — HIV ANTIBODY (ROUTINE TESTING W REFLEX): HIV Screen 4th Generation wRfx: NONREACTIVE

## 2019-09-04 LAB — RPR: RPR Ser Ql: NONREACTIVE

## 2019-09-05 ENCOUNTER — Other Ambulatory Visit: Payer: Self-pay | Admitting: Medical

## 2019-09-05 LAB — CHLAMYDIA/GONOCOCCUS/TRICHOMONAS, NAA
Chlamydia by NAA: NEGATIVE
Gonococcus by NAA: NEGATIVE
Trich vag by NAA: POSITIVE — AB

## 2019-09-05 MED ORDER — METRONIDAZOLE 500 MG PO TABS: ORAL_TABLET | ORAL | 0 refills | Status: DC

## 2019-09-26 ENCOUNTER — Ambulatory Visit: Payer: 59 | Attending: Internal Medicine

## 2019-09-26 DIAGNOSIS — Z23 Encounter for immunization: Secondary | ICD-10-CM

## 2019-09-26 NOTE — Progress Notes (Signed)
   Covid-19 Vaccination Clinic  Name:  Fransico Omahony    MRN: [id number] DOB: 09/26/1975  09/26/2019  Mr. Ivanova was observed post Covid-19 immunization for 15 minutes without incident. He was provided with Vaccine Information Sheet and instruction to access the V-Safe system.   Mr. Salmo was instructed to call 911 with any severe reactions post vaccine: Marland Kitchen Difficulty breathing  . Swelling of face and throat  . A fast heartbeat  . A bad rash all over body  . Dizziness and weakness   Immunizations Administered    Name Date Dose VIS Date Route   Pfizer COVID-19 Vaccine 09/26/2019  9:21 AM 0.3 mL 07/17/2018 Intramuscular   Manufacturer: [place]   Lot: [id number]   [place]: [id number]

## 2019-10-09 ENCOUNTER — Telehealth: Payer: Self-pay | Admitting: Gastroenterology

## 2019-10-09 NOTE — Telephone Encounter (Signed)
Hi Dr. Silverio Decamp, we have received a referral from patient's PCP for a repeat colon. Patient had colon in 2016 with th [facility]. Records have been received and they will be sent to you for review. Please advise on scheduling. Thank you.

## 2019-10-13 ENCOUNTER — Other Ambulatory Visit: Payer: Self-pay

## 2019-10-13 ENCOUNTER — Ambulatory Visit (HOSPITAL_COMMUNITY)
Admission: EM | Admit: 2019-10-13 | Discharge: 2019-10-13 | Disposition: A | Discharge status: Home / Self Care | Payer: 59 | Attending: Family Medicine | Admitting: Family Medicine

## 2019-10-13 ENCOUNTER — Encounter (HOSPITAL_COMMUNITY): Payer: Self-pay | Admitting: Emergency Medicine

## 2019-10-13 DIAGNOSIS — L0291 Cutaneous abscess, unspecified: Secondary | ICD-10-CM

## 2019-10-13 MED ORDER — AMOXICILLIN-POT CLAVULANATE 875-125 MG PO TABS: 1.0000 | ORAL_TABLET | Freq: Two times a day (BID) | ORAL | 0 refills | Status: DC

## 2019-10-13 NOTE — ED Triage Notes (Signed)
PT has a painful abscess on buttocks that has been there for a few months. PT has tried to drain area without success.   Has derm appointment Wednesday.

## 2019-10-13 NOTE — Discharge Instructions (Signed)
Take the antibiotic as directed 2 x a day Keep a bandage on as long as it is needed

## 2019-10-13 NOTE — ED Provider Notes (Signed)
[place]    CSN: [id number] Arrival date & time: 10/13/19  1008      History   Chief Complaint Chief Complaint  Patient presents with  . Abscess    HPI Bruce Wiley is a 44 y.o. male.   HPI  Patient has an abscess on his left buttock that is been worsening over the last couple of weeks.  He is having pain with sitting.  No drainage.  Past Medical History:  Diagnosis Date  . Genital herpes   . GERD (gastroesophageal reflux disease)   . Recurrent cold sores     Patient Active Problem List   Diagnosis Date Noted  . Screen for STD (sexually transmitted disease) 09/03/2019  . Vaccine counseling 09/03/2019  . Ataxia 09/03/2019  . Skin lesion 09/03/2019  . Genital herpes simplex 07/31/2019  . Family history of prostate cancer in father 07/31/2019  . Family history of colon cancer in mother 07/31/2019  . Tonsillar exudate 07/31/2019  . Lymphadenitis 07/31/2019  . Lumbar back pain 05/13/2019  . Recurrent cold sores 12/12/2017  . Chronic abdominal pain 08/07/2017  . Globus sensation 08/07/2017  . Gastroesophageal reflux disease 08/07/2017  . Hemorrhoids 08/07/2017  . Erectile dysfunction 08/07/2017  . Irritable bowel syndrome with both constipation and diarrhea 08/07/2017    Past Surgical History:  Procedure Laterality Date  . COLONOSCOPY  2013   Dr. Paulita Fujita  . LACERATION REPAIR     left volar wrist       Home Medications    Prior to Admission medications   Medication Sig Start Date End Date Taking? Authorizing Provider  sildenafil (VIAGRA) 50 MG tablet Take 1 tablet (50 mg total) by mouth daily as needed for erectile dysfunction. 09/03/19  Yes Tysinger, Camelia Eng, PA-C  valACYclovir (VALTREX) 500 MG tablet Take 1 tablet (500 mg total) by mouth daily. Daily for suppression, use 4 tablets BID x 1 day for flare up 09/03/19  Yes Tysinger, Camelia Eng, PA-C  albuterol (PROVENTIL HFA;VENTOLIN HFA) 108 (90 Base) MCG/ACT inhaler Inhale 1-2 puffs into the  lungs every 6 (six) hours as needed for wheezing or shortness of breath. Patient not taking: Reported on 07/31/2019 02/25/18   Augusto Gamble B, NP  amoxicillin-clavulanate (AUGMENTIN) 875-125 MG tablet Take 1 tablet by mouth every 12 (twelve) hours. 10/13/19   Raylene Everts, MD  omeprazole (PRILOSEC) 40 MG capsule Take 1 capsule (40 mg total) by mouth daily. 07/31/19   Tysinger, Camelia Eng, PA-C    Family History Family History  Problem Relation Age of Onset  . Breast cancer Mother   . Colon cancer Mother 60  . Diabetes Father   . Cancer Father 61       prostate, brain  . Stroke Brother 14  . Multiple sclerosis Brother   . Cancer Paternal Aunt        breast  . Multiple sclerosis Cousin   . Multiple sclerosis Cousin   . Heart disease Neg Hx     Social History Social History   Tobacco Use  . Smoking status: Current Some Day Smoker    Types: Cigars  . Smokeless tobacco: Never Used  Substance Use Topics  . Alcohol use: Not Currently    Comment: some heavy alcohol in the past for 1 year  . Drug use: Yes    Types: Marijuana     Allergies   Patient has no known allergies.   Review of Systems Review of Systems  Skin: Positive for wound.  Physical Exam Triage Vital Signs ED Triage Vitals  Enc Vitals Group     BP 10/13/19 1023 124/76     Pulse Rate 10/13/19 1023 61     Resp 10/13/19 1023 16     Temp 10/13/19 1023 98.5 F (36.9 C)     Temp Source 10/13/19 1023 Oral     SpO2 10/13/19 1023 97 %     Weight --      Height --      Head Circumference --      Peak Flow --      Pain Score 10/13/19 1021 7     Pain Loc --      Pain Edu? --      Excl. in [place]? --    No data found.  Updated Vital Signs BP 124/76   Pulse 61   Temp 98.5 F (36.9 C) (Oral)   Resp 16   SpO2 97%   Physical Exam Constitutional:      General: He is not in acute distress.    Appearance: He is well-developed.     Comments: Exam by observation  HENT:     Head: Normocephalic and  atraumatic.     Mouth/Throat:     Comments: Mask in place Eyes:     Conjunctiva/sclera: Conjunctivae normal.     Pupils: Pupils are equal, round, and reactive to light.  Cardiovascular:     Rate and Rhythm: Normal rate.  Pulmonary:     Effort: Pulmonary effort is normal. No respiratory distress.  Musculoskeletal:        General: Normal range of motion.     Cervical back: Normal range of motion.  Skin:    General: Skin is warm and dry.  Neurological:     Mental Status: He is alert.  Psychiatric:        Mood and Affect: Mood normal.        Behavior: Behavior normal.      UC Treatments / Results  Labs (all labs ordered are listed, but only abnormal results are displayed) Labs Reviewed - No data to display  EKG   Radiology No results found.  Procedures Incision and Drainage  Date/Time: 10/13/2019 11:27 AM Performed by: Raylene Everts, MD Authorized by: Raylene Everts, MD   Consent:    Consent obtained:  Verbal   Consent given by:  Patient   Risks discussed:  Bleeding and incomplete drainage   Alternatives discussed:  Delayed treatment Location:    Type:  Abscess   Location:  Anogenital Pre-procedure details:    Skin preparation:  Antiseptic wash Anesthesia (see MAR for exact dosages):    Anesthesia method:  Local infiltration   Local anesthetic:  Lidocaine 2% w/o epi Procedure type:    Complexity:  Simple Procedure details:    Needle aspiration: no     Incision types:  Stab incision   Incision depth:  Subcutaneous   Scalpel blade:  11   Wound management:  Probed and deloculated   Drainage:  Purulent   Drainage amount:  Copious   Wound treatment:  Wound left open   Packing materials:  None Post-procedure details:    Patient tolerance of procedure:  Tolerated well, no immediate complications     Medications Ordered in UC Medications - No data to display  Initial Impression / Assessment and Plan / UC Course  I have reviewed the triage vital  signs and the nursing notes.  Pertinent labs & imaging results that were  available during my care of the patient were reviewed by me and considered in my medical decision making (see chart for details).     Inform patient that this will continue to drain for a couple of days.  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  Return as needed Final Clinical Impressions(s) / UC Diagnoses   Final diagnoses:  Abscess     Discharge Instructions     Take the antibiotic as directed 2 x a day Keep a bandage on as long as it is needed     ED Prescriptions    Medication Sig Dispense Auth. Provider   amoxicillin-clavulanate (AUGMENTIN) 875-125 MG tablet Take 1 tablet by mouth every 12 (twelve) hours. 14 tablet Raylene Everts, MD     PDMP not reviewed this encounter.   Raylene Everts, MD 10/13/19 1128

## 2019-10-22 ENCOUNTER — Ambulatory Visit: Payer: 59

## 2019-10-24 ENCOUNTER — Ambulatory Visit: Payer: 59 | Attending: Internal Medicine

## 2019-10-24 DIAGNOSIS — Z23 Encounter for immunization: Secondary | ICD-10-CM

## 2019-10-24 NOTE — Progress Notes (Signed)
   Covid-19 Vaccination Clinic  Name:  Bruce Wiley    MRN: [id number] DOB: April 01, 1976  10/24/2019  Mr. [name] was observed post Covid-19 immunization for 15 minutes without incident. He was provided with Vaccine Information Sheet and instruction to access the V-Safe system.   [name] was instructed to call 911 with any severe reactions post vaccine: Marland Kitchen Difficulty breathing  . Swelling of face and throat  . A fast heartbeat  . A bad rash all over body  . Dizziness and weakness   Immunizations Administered    Name Date Dose VIS Date Route   Pfizer COVID-19 Vaccine 10/24/2019 10:13 AM 0.3 mL 07/17/2018 Intramuscular   Manufacturer: [employer]   Lot: [id number]   [place]: [id number]

## 2019-10-28 NOTE — Telephone Encounter (Signed)
Please follow up on status of records review.

## 2019-10-31 ENCOUNTER — Encounter: Payer: Self-pay | Admitting: Nurse Practitioner

## 2019-10-31 NOTE — Telephone Encounter (Signed)
Left message to call back.  Dr. Silverio Decamp reviewed pt's last colon records and indicated to schedule direct colon due to tubular adenoma removed in 2016.

## 2019-11-11 NOTE — Telephone Encounter (Signed)
Prior to scheduling direct colon patient requested to be seen for upper GI issues to see if it would be recommended for him to have an EGD as well to schedule a double. Scheduled with APP to discuss EGD along with the recommended direct colon by Dr. Silverio Decamp.

## 2019-11-27 ENCOUNTER — Encounter: Payer: Self-pay | Admitting: Physician Assistant

## 2019-12-04 ENCOUNTER — Ambulatory Visit: Payer: 59 | Admitting: Nurse Practitioner

## 2019-12-18 ENCOUNTER — Ambulatory Visit (INDEPENDENT_AMBULATORY_CARE_PROVIDER_SITE_OTHER): Payer: 59 | Admitting: Physician Assistant

## 2019-12-18 ENCOUNTER — Encounter: Payer: Self-pay | Admitting: Physician Assistant

## 2019-12-18 VITALS — BP 100/68 | HR 60 | Ht 72.5 in | Wt 198.1 lb

## 2019-12-18 DIAGNOSIS — R0989 Other specified symptoms and signs involving the circulatory and respiratory systems: Secondary | ICD-10-CM

## 2019-12-18 DIAGNOSIS — Z800 Family history of malignant neoplasm of digestive organs: Secondary | ICD-10-CM

## 2019-12-18 DIAGNOSIS — R6889 Other general symptoms and signs: Secondary | ICD-10-CM

## 2019-12-18 DIAGNOSIS — K219 Gastro-esophageal reflux disease without esophagitis: Secondary | ICD-10-CM

## 2019-12-18 DIAGNOSIS — Z86010 Personal history of colonic polyps: Secondary | ICD-10-CM

## 2019-12-18 DIAGNOSIS — R110 Nausea: Secondary | ICD-10-CM

## 2019-12-18 MED ORDER — OMEPRAZOLE 40 MG PO CPDR: 40.0000 mg | DELAYED_RELEASE_CAPSULE | Freq: Every morning | ORAL | 11 refills | Status: DC

## 2019-12-18 MED ORDER — SUTAB 1479-225-188 MG PO TABS: 1.0000 | ORAL_TABLET | ORAL | 0 refills | Status: DC

## 2019-12-18 NOTE — Patient Instructions (Addendum)
If you are age 42 or older, your body mass index should be between 23-30. Your Body mass index is 26.5 kg/m. If this is out of the aforementioned range listed, please consider follow up with your Primary Care Provider.  If you are age 26 or younger, your body mass index should be between 19-25. Your Body mass index is 26.5 kg/m. If this is out of the aformentioned range listed, please consider follow up with your Primary Care Provider.   You have been scheduled for an endoscopy and colonoscopy. Please follow the written instructions given to you at your visit today. Please pick up your prep supplies at the pharmacy within the next 1-3 days. If you use inhalers (even only as needed), please bring them with you on the day of your procedure.  Continue Omeprazole 40 mg every morning before breakfast.  Follow up pending at this time.

## 2019-12-18 NOTE — Progress Notes (Signed)
Subjective:    Patient ID: Bruce Wiley, male    DOB: 02-06-1976, 44 y.o.   MRN: [id number]  HPI Bruce Wiley is a pleasant 44 year old African-American male, referred to GI today by Bruce Bode, PA-C.  He has history of adenomatous colon polyps on prior colonoscopy done in 2016 per Dr. Paulita Wiley, and also has family history of colon cancer in his mother. Patient had been having some upper GI symptoms so also requested office visit to consider EGD. Patient has history of GERD, IBS, and hemorrhoids in addition to above. Last colonoscopy February 2016 for follow-up of adenomatous polyps with finding of 2 5 to 6 mm polyps and one 3 mm polyp.  2 of these were tubular adenomas and one was hyperplastic.  He did have EGD in January 2016 per Dr. Paulita Wiley which was normal. Patient says he has had problems with hemorrhoids long-term and uses Preparation H fairly regularly.  He may have noticed a small amount of blood stool or red tent in the water a couple of months ago but this is not been persistent. He generally takes omeprazole on a as needed basis but says over the past few months he has been having more persistent symptoms with a mild sore throat sensation and constant need to clear his throat.  He denies any daily heartburn.  He says that he quit eating beef a while back and that seemed to help his reflux symptoms as well as some of his IBS symptoms.  Is not having any current problems with abdominal pain.  Appetite has been fair some decrease over the past month but has also had a change in his work schedule etc.  He had also recently stopped consuming any alcohol.  He is concerned about his esophagus and stomach.  Review of Systems Pertinent positive and negative review of systems were noted in the above HPI section.  All other review of systems was otherwise negative.  Outpatient Encounter Medications as of 12/18/2019  Medication Sig  . Lidocaine-Glycerin (PREPARATION H EX) Apply topically daily as  needed.  . mupirocin cream (BACTROBAN) 2 % Apply 1 application topically 2 (two) times daily.  Marland Kitchen omeprazole (PRILOSEC) 40 MG capsule Take 1 capsule (40 mg total) by mouth in the morning.  . sildenafil (VIAGRA) 50 MG tablet Take 1 tablet (50 mg total) by mouth daily as needed for erectile dysfunction.  . valACYclovir (VALTREX) 500 MG tablet Take 1 tablet (500 mg total) by mouth daily. Daily for suppression, use 4 tablets BID x 1 day for flare up  . [DISCONTINUED] omeprazole (PRILOSEC) 40 MG capsule Take 1 capsule (40 mg total) by mouth daily.  . Sodium Sulfate-Mag Sulfate-KCl (SUTAB) [phone] MG TABS Take 1 kit by mouth as directed. BIN: [id number] PCN: CN GROUP: [id number] MEMBER ID: [id number];SNK AS CASH;NO PRIOR AUTHORIZATION  . [DISCONTINUED] albuterol (PROVENTIL HFA;VENTOLIN HFA) 108 (90 Base) MCG/ACT inhaler Inhale 1-2 puffs into the lungs every 6 (six) hours as needed for wheezing or shortness of breath.  . [DISCONTINUED] amoxicillin-clavulanate (AUGMENTIN) 875-125 MG tablet Take 1 tablet by mouth every 12 (twelve) hours.   No facility-administered encounter medications on file as of 12/18/2019.   Allergies  Allergen Reactions  . Pineapple Flavor Itching   Patient Active Problem List   Diagnosis Date Noted  . Screen for STD (sexually transmitted disease) 09/03/2019  . Vaccine counseling 09/03/2019  . Ataxia 09/03/2019  . Skin lesion 09/03/2019  . Genital herpes simplex 07/31/2019  . Family history of prostate cancer in father  07/31/2019  . Family history of colon cancer in mother 07/31/2019  . Tonsillar exudate 07/31/2019  . Lymphadenitis 07/31/2019  . Lumbar back pain 05/13/2019  . Recurrent cold sores 12/12/2017  . Chronic abdominal pain 08/07/2017  . Globus sensation 08/07/2017  . Gastroesophageal reflux disease 08/07/2017  . Hemorrhoids 08/07/2017  . Erectile dysfunction 08/07/2017  . Irritable bowel syndrome with both constipation and diarrhea 08/07/2017   Social  History   Socioeconomic History  . Marital status: Single    Spouse name: Not on file  . Number of children: 2  . Years of education: Not on file  . Highest education level: Not on file  Occupational History  . Not on file  Tobacco Use  . Smoking status: Former Smoker    Types: Cigars, Cigarettes    Quit date: 09/18/2019    Years since quitting: 0.2  . Smokeless tobacco: Never Used  Vaping Use  . Vaping Use: Former  Substance and Sexual Activity  . Alcohol use: Not Currently    Comment: some heavy alcohol in the past for 1 year  . Drug use: Yes    Types: Marijuana  . Sexual activity: Not on file  Other Topics Concern  . 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 Difficulty of Paying Living Expenses:   Food Insecurity:   . Worried About Charity fundraiser in the Last Year:   . Arboriculturist in the Last Year:   Transportation Needs:   . Film/video editor (Medical):   Marland Kitchen Lack of Transportation (Non-Medical):   Physical Activity:   . Days of Exercise per Week:   . Minutes of Exercise per Session:   Stress:   . Feeling of Stress :   Social Connections:   . Frequency of Communication with Friends and Family:   . Frequency of Social Gatherings with Friends and Family:   . Attends Religious Services:   . Active Member of Clubs or Organizations:   . Attends Archivist Meetings:   Marland Kitchen Marital Status:   Intimate Partner Violence:   . Fear of Current or Ex-Partner:   . Emotionally Abused:   Marland Kitchen Physically Abused:   . Sexually Abused:     [name] family history includes Breast cancer in his mother and paternal aunt; Cancer (age of onset: 30) in his father; Colon cancer (age of onset: 80) in his mother; Diabetes in his father; Hypertension in his father; Multiple sclerosis in his brother, cousin, and  cousin; Stroke (age of onset: 33) in his brother.      Objective:    Vitals:   12/18/19 1423  BP: 100/68  Pulse: 60    Physical Exam Well-developed well-nourished AA male in no acute distress.  Height, Weight,198 BMI26.5  HEENT; nontraumatic normocephalic, EOMI, PER R LA, sclera anicteric. Oropharynx; not done Neck; supple, no JVD Cardiovascular; regular rate and rhythm with S1-S2, no murmur rub or gallop Pulmonary; Clear bilaterally Abdomen; soft, nontender, nondistended, no palpable mass or hepatosplenomegaly, bowel sounds are active Rectal; not done today Skin; benign exam, no jaundice rash or appreciable lesions Extremities; no clubbing cyanosis or edema skin warm and dry Neuro/Psych; alert and oriented x4, grossly nonfocal mood and affect appropriate       Assessment & Plan:   #56 44 year old African-American  male with family history of colon cancer in his mother and personal history of adenomatous and sessile serrated polyps, due for follow-up colonoscopy. 2.  History of hemorrhoids 3.  GERD, no regular PPI use.  Patient has been having a sore throat over the past couple of months and constant need to clear his throat in addition to decrease in appetite recently.  I suspect the symptoms are due to poorly controlled GERD, rule out esophagitis, rule out peptic ulcer disease.  Plan; patient will be scheduled for colonoscopy and upper endoscopy with Dr. Hilarie Fredrickson.  Both procedures were discussed in detail with the patient including indications risks and benefits and he is agreeable to proceed. Patient has completed COVID-19 vaccination. Patient was advised to restart omeprazole 40 mg p.o. every morning on a regular basis AC breakfast. We also discussed need for ongoing every 5 year colonoscopies given his family history. Further recommendations pending findings at endoscopic evaluation.  Keera Altidor Genia Harold PA-C 12/18/2019   Cc: Carlena Hurl, PA-C

## 2020-01-13 NOTE — Progress Notes (Signed)
Addendum: Reviewed and agree with assessment and management plan. Railynn Ballo M, MD  

## 2020-02-05 ENCOUNTER — Other Ambulatory Visit: Payer: Self-pay

## 2020-02-05 ENCOUNTER — Encounter (HOSPITAL_COMMUNITY): Payer: Self-pay

## 2020-02-05 ENCOUNTER — Ambulatory Visit (HOSPITAL_COMMUNITY)
Admission: EM | Admit: 2020-02-05 | Discharge: 2020-02-05 | Disposition: A | Discharge status: Home / Self Care | Payer: 59 | Attending: Family Medicine | Admitting: Family Medicine

## 2020-02-05 DIAGNOSIS — L739 Follicular disorder, unspecified: Secondary | ICD-10-CM

## 2020-02-05 DIAGNOSIS — D171 Benign lipomatous neoplasm of skin and subcutaneous tissue of trunk: Secondary | ICD-10-CM

## 2020-02-05 MED ORDER — DOXYCYCLINE HYCLATE 100 MG PO CAPS: 100.0000 mg | ORAL_CAPSULE | Freq: Two times a day (BID) | ORAL | 0 refills | Status: DC

## 2020-02-05 MED ORDER — MUPIROCIN CALCIUM 2 % EX CREA: 1.0000 "application " | TOPICAL_CREAM | Freq: Two times a day (BID) | CUTANEOUS | 1 refills | Status: DC

## 2020-02-05 MED ORDER — CHLORHEXIDINE GLUCONATE 4 % EX LIQD: CUTANEOUS | 1 refills | Status: DC

## 2020-02-05 NOTE — ED Triage Notes (Signed)
Pt c/o abscess on left inner buttocksx2 days.

## 2020-02-05 NOTE — ED Provider Notes (Addendum)
[place]   [id number] 02/05/20 Arrival Time: 5093  ASSESSMENT & PLAN:  1. Chronic folliculitis   2. Lipoma of back     Meds ordered this encounter  Medications  . mupirocin cream (BACTROBAN) 2 %    Sig: Apply 1 application topically 2 (two) times daily.    Dispense:  30 g    Refill:  1  . doxycycline (VIBRAMYCIN) 100 MG capsule    Sig: Take 1 capsule (100 mg total) by mouth 2 (two) times daily.    Dispense:  20 capsule    Refill:  0  . chlorhexidine (HIBICLENS) 4 % external liquid    Sig: Use 3x weekly.    Dispense:  473 mL    Refill:  1     Follow-up Information    Carlena Hurl, PA-C.   Specialty: Family Medicine Why: If worsening or failing to improve as anticipated. Contact information: [place] [address] [phone]        Schedule an appointment as soon as possible for a visit  with Surgery, [place].   Specialty: General Surgery Why: To evaluate the lipoma on your back. Contact information: [address] [place] [address] [phone]               Will follow up with PCP or here if worsening or failing to improve as anticipated. Reviewed expectations re: course of current medical issues. Questions answered. Outlined signs and symptoms indicating need for more acute intervention. Patient verbalized understanding. After Visit Summary given.   SUBJECTIVE:  Bruce Wiley is a 44 y.o. male who presents with a skin complaint. Recurrent skin infections, esp after shaving. Face, waistline; buttocks; several days. Has opened a couple of areas with slight drainage.  Also with "big lump" on my back; years; no pain.   OBJECTIVE: Vitals:   02/05/20 1744 02/05/20 1745  BP: 130/76   Pulse: (!) 59   Resp: 16   Temp: 98.4 F (36.9 C)   TempSrc: Oral   SpO2: 97%   Weight:  87.1 kg  Height:  6' 0.5" (1.842 m)    General appearance: alert; no distress HEENT: [place]; AT Neck: supple with  FROM Lungs: clear to auscultation bilaterally Heart: regular rate and rhythm Extremities: no edema; moves all extremities normally Skin: warm and dry; areas of folliculitis over face, waistline, buttock; no abscess formation; very large lipoma of back Psychological: alert and cooperative; normal mood and affect  Allergies  Allergen Reactions  . Pineapple Flavor Itching    Past Medical History:  Diagnosis Date  . Genital herpes   . GERD (gastroesophageal reflux disease)   . Hemorrhoids   . Recurrent cold sores    Social History   Socioeconomic History  . Marital status: Single    Spouse name: Not on file  . Number of children: 2  . Years of education: Not on file  . Highest education level: Not on file  Occupational History  . Not on file  Tobacco Use  . Smoking status: Former Smoker    Types: Cigars, Cigarettes    Quit date: 09/18/2019    Years since quitting: 0.3  . Smokeless tobacco: Never Used  Vaping Use  . Vaping Use: Former  Substance and Sexual Activity  . Alcohol use: Not Currently    Comment: some heavy alcohol in the past for 1 year  . Drug use: Yes    Types: Marijuana  . Sexual activity: Not  on file  Other Topics Concern  . 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 Difficulty of Paying Living Expenses: Not on file  Food Insecurity:   . Worried About Charity fundraiser in the Last Year: Not on file  . Ran Out of Food in the Last Year: Not on file  Transportation Needs:   . Lack of Transportation (Medical): Not on file  . Lack of Transportation (Non-Medical): Not on file  Physical Activity:   . Days of Exercise per Week: Not on file  . Minutes of Exercise per Session: Not on file  Stress:   . Feeling of Stress : Not on file  Social Connections:   . Frequency of Communication with Friends and  Family: Not on file  . Frequency of Social Gatherings with Friends and Family: Not on file  . Attends Religious Services: Not on file  . Active Member of Clubs or Organizations: Not on file  . Attends Archivist Meetings: Not on file  . Marital Status: Not on file  Intimate Partner Violence:   . Fear of Current or Ex-Partner: Not on file  . Emotionally Abused: Not on file  . Physically Abused: Not on file  . Sexually Abused: Not on file   Family History  Problem Relation Age of Onset  . Breast cancer Mother   . Colon cancer Mother 107  . Diabetes Father   . Cancer Father 65       prostate, brain  . Hypertension Father   . Stroke Brother 51  . Multiple sclerosis Brother   . Breast cancer Paternal Aunt   . Multiple sclerosis Cousin   . Multiple sclerosis Cousin   . Heart disease Neg Hx    Past Surgical History:  Procedure Laterality Date  . COLONOSCOPY  2013   Dr. Paulita Fujita  . LACERATION REPAIR Left    left volar wrist     Vanessa Kick, MD 02/05/20 Forestine Na, MD 02/05/20 [phone]

## 2020-02-10 ENCOUNTER — Other Ambulatory Visit: Payer: Self-pay

## 2020-02-10 ENCOUNTER — Encounter: Payer: Self-pay | Admitting: Internal Medicine

## 2020-02-10 ENCOUNTER — Ambulatory Visit (AMBULATORY_SURGERY_CENTER): Payer: 59 | Admitting: Internal Medicine

## 2020-02-10 VITALS — BP 104/62 | HR 50 | Temp 96.8°F | Resp 11 | Ht 72.0 in | Wt 198.0 lb

## 2020-02-10 DIAGNOSIS — K2970 Gastritis, unspecified, without bleeding: Secondary | ICD-10-CM

## 2020-02-10 DIAGNOSIS — Z86010 Personal history of colonic polyps: Secondary | ICD-10-CM

## 2020-02-10 DIAGNOSIS — Z800 Family history of malignant neoplasm of digestive organs: Secondary | ICD-10-CM

## 2020-02-10 DIAGNOSIS — K219 Gastro-esophageal reflux disease without esophagitis: Secondary | ICD-10-CM

## 2020-02-10 DIAGNOSIS — D120 Benign neoplasm of cecum: Secondary | ICD-10-CM

## 2020-02-10 DIAGNOSIS — K635 Polyp of colon: Secondary | ICD-10-CM | POA: Diagnosis not present

## 2020-02-10 MED ORDER — SODIUM CHLORIDE 0.9 % IV SOLN: 500.0000 mL | Freq: Once | INTRAVENOUS | Status: DC

## 2020-02-10 MED ORDER — PANTOPRAZOLE SODIUM 40 MG PO TBEC: 40.0000 mg | DELAYED_RELEASE_TABLET | Freq: Every day | ORAL | 0 refills | Status: DC

## 2020-02-10 NOTE — Patient Instructions (Signed)
Handouts on polyps, diverticulosis, and gastritis given to you today  Await pathology results    YOU HAD AN ENDOSCOPIC PROCEDURE TODAY AT [facility]:   Refer to the procedure report that was given to you for any specific questions about what was found during the examination.  If the procedure report does not answer your questions, please call your gastroenterologist to clarify.  If you requested that your care partner not be given the details of your procedure findings, then the procedure report has been included in a sealed envelope for you to review at your convenience later.  YOU SHOULD EXPECT: Some feelings of bloating in the abdomen. Passage of more gas than usual.  Walking can help get rid of the air that was put into your GI tract during the procedure and reduce the bloating. If you had a lower endoscopy (such as a colonoscopy or flexible sigmoidoscopy) you may notice spotting of blood in your stool or on the toilet paper. If you underwent a bowel prep for your procedure, you may not have a normal bowel movement for a few days.  Please Note:  You might notice some irritation and congestion in your nose or some drainage.  This is from the oxygen used during your procedure.  There is no need for concern and it should clear up in a day or so.  SYMPTOMS TO REPORT IMMEDIATELY:   Following lower endoscopy (colonoscopy or flexible sigmoidoscopy):  Excessive amounts of blood in the stool  Significant tenderness or worsening of abdominal pains  Swelling of the abdomen that is new, acute  Fever of 100F or higher   Following upper endoscopy (EGD)  Vomiting of blood or coffee ground material  New chest pain or pain under the shoulder blades  Painful or persistently difficult swallowing  New shortness of breath  Fever of 100F or higher  Black, tarry-looking stools  For urgent or emergent issues, a gastroenterologist can be reached at any hour by calling [phone]. Do  not use MyChart messaging for urgent concerns.    DIET:  We do recommend a small meal at first, but then you may proceed to your regular diet.  Drink plenty of fluids but you should avoid alcoholic beverages for 24 hours.  ACTIVITY:  You should plan to take it easy for the rest of today and you should NOT DRIVE or use heavy machinery until tomorrow (because of the sedation medicines used during the test).    FOLLOW UP: Our staff will call the number listed on your records 48-72 hours following your procedure to check on you and address any questions or concerns that you may have regarding the information given to you following your procedure. If we do not reach you, we will leave a message.  We will attempt to reach you two times.  During this call, we will ask if you have developed any symptoms of COVID 19. If you develop any symptoms (ie: fever, flu-like symptoms, shortness of breath, cough etc.) before then, please call [phone].  If you test positive for Covid 19 in the 2 weeks post procedure, please call and report this information to [place].    If any biopsies were taken you will be contacted by phone or by letter within the next 1-3 weeks.  Please call us at [phone] if you have not heard about the biopsies in 3 weeks.    SIGNATURES/CONFIDENTIALITY: You and/or your care partner have signed paperwork which will be entered into  your electronic medical record.  These signatures attest to the fact that that the information above on your After Visit Summary has been reviewed and is understood.  Full responsibility of the confidentiality of this discharge information lies with you and/or your care-partner.

## 2020-02-10 NOTE — Progress Notes (Signed)
Check-in-JB  V/S-AG

## 2020-02-10 NOTE — Op Note (Signed)
[facility] Patient Name: Bruce Wiley Procedure Date: 02/10/2020 1:26 PM MRN: [id number] Endoscopist: Jerene Bears , MD Age: 44 Referring MD:  Date of Birth: August 09, 1975 Gender: Male Account #: [id number] Procedure:                Colonoscopy Indications:              High risk colon cancer surveillance: Personal                            history of non-advanced adenoma in Jan 2016, Family                            history of colon cancer in patient's mother; last                            colonoscopy Jan 2016 Medicines:                Monitored Anesthesia Care Procedure:                Pre-Anesthesia Assessment:                           - Prior to the procedure, a History and Physical                            was performed, and patient medications and                            allergies were reviewed. The patient's tolerance of                            previous anesthesia was also reviewed. The risks                            and benefits of the procedure and the sedation                            options and risks were discussed with the patient.                            All questions were answered, and informed consent                            was obtained. Prior Anticoagulants: The patient has                            taken no previous anticoagulant or antiplatelet                            agents. ASA Grade Assessment: II - A patient with                            mild systemic disease. After reviewing the risks  and benefits, the patient was deemed in                            satisfactory condition to undergo the procedure.                           After obtaining informed consent, the colonoscope                            was passed under direct vision. Throughout the                            procedure, the patient's blood pressure, pulse, and                            oxygen saturations were monitored  continuously. The                            Colonoscope was introduced through the anus and                            advanced to the terminal ileum. The colonoscopy was                            performed without difficulty. The patient tolerated                            the procedure well. The quality of the bowel                            preparation was excellent. The terminal ileum,                            ileocecal valve, appendiceal orifice, and rectum                            were photographed. Scope In: 1:39:02 PM Scope Out: 1:53:34 PM Scope Withdrawal Time: 0 hours 12 minutes 17 seconds  Total Procedure Duration: 0 hours 14 minutes 32 seconds  Findings:                 The digital rectal exam was normal.                           The terminal ileum appeared normal.                           A 8 mm polyp was found in the cecum. The polyp was                            sessile. The polyp was removed with a cold snare.                            Resection and retrieval were complete.  A few small-mouthed diverticula were found in the                            proximal transverse colon, hepatic flexure and                            distal ascending colon.                           Internal hemorrhoids were found during                            retroflexion. The hemorrhoids were small.                           The exam was otherwise without abnormality. Complications:            No immediate complications. Estimated Blood Loss:     Estimated blood loss was minimal. Impression:               - The examined portion of the ileum was normal.                           - One 8 mm polyp in the cecum, removed with a cold                            snare. Resected and retrieved.                           - Diverticulosis in the proximal transverse colon,                            at the hepatic flexure and in the distal ascending                             colon.                           - Small internal hemorrhoids.                           - The examination was otherwise normal. Recommendation:           - Patient has a contact number available for                            emergencies. The signs and symptoms of potential                            delayed complications were discussed with the                            patient. Return to normal activities tomorrow.                            Written discharge instructions were provided to the  patient.                           - Resume previous diet.                           - Continue present medications.                           - Await pathology results.                           - Repeat colonoscopy is recommended for                            surveillance. The colonoscopy date will be                            determined after pathology results from today's                            exam become available for review. Jerene Bears, MD 02/10/2020 1:58:52 PM This report has been signed electronically.

## 2020-02-10 NOTE — Progress Notes (Signed)
Called to room to assist during endoscopic procedure.  Patient ID and intended procedure confirmed with present staff. Received instructions for my participation in the procedure from the performing physician.  

## 2020-02-10 NOTE — Op Note (Signed)
[place] Patient Name: Bruce Wiley Procedure Date: 02/10/2020 1:27 PM MRN: [id number] Endoscopist: Jerene Bears , MD Age: 44 Referring MD:  Date of Birth: 06-10-1975 Gender: Male Account #: [id number] Procedure:                Upper GI endoscopy Indications:              Gastro-esophageal reflux disease Medicines:                Monitored Anesthesia Care Procedure:                Pre-Anesthesia Assessment:                           - Prior to the procedure, a History and Physical                            was performed, and patient medications and                            allergies were reviewed. The patient's tolerance of                            previous anesthesia was also reviewed. The risks                            and benefits of the procedure and the sedation                            options and risks were discussed with the patient.                            All questions were answered, and informed consent                            was obtained. Prior Anticoagulants: The patient has                            taken no previous anticoagulant or antiplatelet                            agents. ASA Grade Assessment: II - A patient with                            mild systemic disease. After reviewing the risks                            and benefits, the patient was deemed in                            satisfactory condition to undergo the procedure.                           After obtaining informed consent, the endoscope was  passed under direct vision. Throughout the                            procedure, the patient's blood pressure, pulse, and                            oxygen saturations were monitored continuously. The                            Endoscope was introduced through the mouth, and                            advanced to the second part of duodenum. The upper                            GI endoscopy was  accomplished without difficulty.                            The patient tolerated the procedure well. Scope In: Scope Out: Findings:                 The examined esophagus was normal.                           Patchy mild inflammation characterized by erosions                            and erythema was found in the gastric body.                            Biopsies were taken with a cold forceps for                            histology and Helicobacter pylori testing.                           The cardia and gastric fundus were normal on                            retroflexion.                           The examined duodenum was normal. Complications:            No immediate complications. Estimated Blood Loss:     Estimated blood loss was minimal. Impression:               - Normal esophagus.                           - Mild gastritis. Biopsied.                           - Normal examined duodenum. Recommendation:           - Patient has a contact number available for  emergencies. The signs and symptoms of potential                            delayed complications were discussed with the                            patient. Return to normal activities tomorrow.                            Written discharge instructions were provided to the                            patient.                           - Resume previous diet.                           - Continue present medications.                           - Await pathology results. Jerene Bears, MD 02/10/2020 1:56:07 PM This report has been signed electronically.

## 2020-02-10 NOTE — Progress Notes (Signed)
Report to PACU, RN, vss, BBS= Clear.  

## 2020-02-12 ENCOUNTER — Telehealth: Payer: Self-pay | Admitting: *Deleted

## 2020-02-12 NOTE — Telephone Encounter (Signed)
°  Follow up Call-  Call back number 02/10/2020  Post procedure Call Back phone  # [phone]  Permission to leave phone message Yes  Some recent data might be hidden     Patient questions:  Do you have a fever, pain , or abdominal swelling? No. Pain Score  0 *  Have you tolerated food without any problems? Yes.    Have you been able to return to your normal activities? Yes.    Do you have any questions about your discharge instructions: Diet   No. Medications  No. Follow up visit  No.  Do you have questions or concerns about your Care? No.  Actions: * If pain score is 4 or above: No action needed, pain <4.  1. Have you developed a fever since your procedure? no  2.   Have you had an respiratory symptoms (SOB or cough) since your procedure? no  3.   Have you tested positive for COVID 19 since your procedure no  4.   Have you had any family members/close contacts diagnosed with the COVID 19 since your procedure?  no   If yes to any of these questions please route to Joylene John, RN and Joella Prince, RN

## 2020-02-12 NOTE — Telephone Encounter (Signed)
Attempted f/u phone call. No answer. Left message. °

## 2020-02-19 ENCOUNTER — Encounter: Payer: Self-pay | Admitting: Internal Medicine

## 2020-05-14 ENCOUNTER — Other Ambulatory Visit: Payer: 59

## 2020-05-14 ENCOUNTER — Other Ambulatory Visit: Payer: Self-pay

## 2020-05-14 DIAGNOSIS — Z20822 Contact with and (suspected) exposure to covid-19: Secondary | ICD-10-CM

## 2020-05-16 LAB — NOVEL CORONAVIRUS, NAA: SARS-CoV-2, NAA: NOT DETECTED

## 2020-05-16 LAB — SPECIMEN STATUS REPORT

## 2020-05-16 LAB — SARS-COV-2, NAA 2 DAY TAT

## 2020-05-18 ENCOUNTER — Other Ambulatory Visit: Payer: 59

## 2020-05-18 DIAGNOSIS — Z20822 Contact with and (suspected) exposure to covid-19: Secondary | ICD-10-CM

## 2020-05-20 LAB — NOVEL CORONAVIRUS, NAA: SARS-CoV-2, NAA: DETECTED — AB

## 2020-05-20 LAB — SARS-COV-2, NAA 2 DAY TAT

## 2020-07-14 ENCOUNTER — Telehealth (INDEPENDENT_AMBULATORY_CARE_PROVIDER_SITE_OTHER): Payer: 59 | Admitting: Medical

## 2020-07-14 ENCOUNTER — Telehealth: Payer: Self-pay | Admitting: Medical

## 2020-07-14 ENCOUNTER — Other Ambulatory Visit: Payer: Self-pay

## 2020-07-14 ENCOUNTER — Encounter: Payer: Self-pay | Admitting: Medical

## 2020-07-14 VITALS — Ht 72.0 in | Wt 209.0 lb

## 2020-07-14 DIAGNOSIS — M542 Cervicalgia: Secondary | ICD-10-CM

## 2020-07-14 DIAGNOSIS — Z87891 Personal history of nicotine dependence: Secondary | ICD-10-CM | POA: Insufficient documentation

## 2020-07-14 DIAGNOSIS — I889 Nonspecific lymphadenitis, unspecified: Secondary | ICD-10-CM

## 2020-07-14 DIAGNOSIS — R519 Headache, unspecified: Secondary | ICD-10-CM | POA: Diagnosis not present

## 2020-07-14 DIAGNOSIS — L989 Disorder of the skin and subcutaneous tissue, unspecified: Secondary | ICD-10-CM | POA: Diagnosis not present

## 2020-07-14 NOTE — Telephone Encounter (Signed)
Please call left in dermatology and get the last 2 office notes from them.  I do not have record that they  sent back to me

## 2020-07-14 NOTE — Telephone Encounter (Signed)
To you 

## 2020-07-14 NOTE — Telephone Encounter (Signed)
Last 2 notes are being faxed over from [facility]

## 2020-07-14 NOTE — Telephone Encounter (Signed)
Forward to [employer] No medical records release signed

## 2020-07-14 NOTE — Progress Notes (Signed)
Subjective:     Patient ID: Bruce Wiley, male   DOB: 10/23/1975, 45 y.o.   MRN: [id number]  This visit type was conducted due to national recommendations for restrictions regarding the COVID-19 Pandemic (e.g. social distancing) in an effort to limit this patient's exposure and mitigate transmission in our community.  Due to their co-morbid illnesses, this patient is at least at moderate risk for complications without adequate follow up.  This format is felt to be most appropriate for this patient at this time.    Documentation for virtual audio and video telecommunications through [employer] encounter:  The patient was located at home. The provider was located in the office. The patient did consent to this visit and is aware of possible charges through their insurance for this visit.  The other persons participating in this telemedicine service were none. Time spent on call was 20 minutes and in review of previous records 20 minutes total.  This virtual service is not related to other E/M service within previous 7 days.   HPI Chief Complaint  Patient presents with  . Referral    Wants referral to ENT    Virtual consult for neck discomfort.  Over a week ago had an unusual pain in neck.  It was hurting if touched, right neck.   Could also feel the pain in back of his lower teeth on right.   When washing head on right while in shower, would feel pain in the posterior scalp.  Doesn't feel the pain now.  As a matter fact all the symptoms have resolved at this point but it took about 2 weeks.  He did go to a dentist, had x-rays and there was no obvious tooth infection or abscess.  He says the dentist palpated his neck and felt some small lymph nodes swollen.    He notes that over the last 2 weeks when he had the symptoms he did not have fever, no rash, no blisters, no URI symptoms, no recent cold or illness.  Otherwise in normal state of health  Former smoker, last smoked 04/2020.  Used  to vaped some but stopped. Does not use chewing tobacco or snuff.    He still wants to skin lesion removed off of his back.  It bothers him and aches at times.  The skin lesion pokes through his T-shirts.  He saw dermatology but he notes that they said they could not remove it   Past Medical History:  Diagnosis Date  . Allergy    seasonal  . Genital herpes   . GERD (gastroesophageal reflux disease)   . Hemorrhoids   . Recurrent cold sores     Review of Systems As in subjective    Objective:   Physical Exam Due to coronavirus pandemic stay at home measures, patient visit was virtual and they were not examined in person.   Ht 6' (1.829 m)   Wt 209 lb (94.8 kg)   BMI 28.35 kg/m   General: Well-developed, well-nourished no acute distress No obvious bulging lump of the face or neck, no obvious rash Exam limited by virtual consult     Assessment:     Encounter Diagnoses  Name Primary?  . Lymphadenitis Yes  . Neck discomfort   . Facial pain   . Skin lesion of back   . Former smoker        Plan:     Given his recent symptoms that have now resolved and given the fact that the dentist  did an evaluation, it sounds like his symptoms were related to acute lymphadenitis, reactive lymph nodes.  He likely had a random virus that has since resolved.  He feels completely back to normal in that regard  I discussed findings that would suggest lymphoma or a mass or tumor.  He does not seem to have any obvious lump in the face or neck.  I did give him the option of coming in for in person exam and evaluation if things change or get worse again  No indication for referral at this point  We discussed the natural process the lymph nodes, fighting off infection, periods of short-term swollen lymph nodes that resolved versus something to be more worrisome like lymphoma  Follow-up on this issue as needed  Regarding the skin lesion of his back.  My prior notes suggest a small sebaceous  cyst.  However he notes that when he talked to dermatology about this they were going to refer him to presumably general surgery.  So he is not sure what the status is of this.  I will get records from [facility] dermatology to review and possibly make referral to general surgery or contact them about this issue  I congratulated him on quitting smoking  Enos was seen today for referral.  Diagnoses and all orders for this visit:  Lymphadenitis  Neck discomfort  Facial pain  Skin lesion of back  Former smoker  f/u pending call back

## 2020-07-14 NOTE — Telephone Encounter (Signed)
We referred him there, so we should just be able to get records

## 2020-07-17 ENCOUNTER — Institutional Professional Consult (permissible substitution): Payer: 59 | Admitting: Medical

## 2020-09-07 ENCOUNTER — Other Ambulatory Visit: Payer: Self-pay | Admitting: Medical

## 2020-09-14 ENCOUNTER — Ambulatory Visit (INDEPENDENT_AMBULATORY_CARE_PROVIDER_SITE_OTHER): Payer: 59 | Admitting: Medical

## 2020-09-14 ENCOUNTER — Other Ambulatory Visit: Payer: Self-pay

## 2020-09-14 ENCOUNTER — Encounter: Payer: Self-pay | Admitting: Medical

## 2020-09-14 VITALS — BP 118/80 | HR 64 | Ht 72.0 in | Wt 207.8 lb

## 2020-09-14 DIAGNOSIS — N401 Enlarged prostate with lower urinary tract symptoms: Secondary | ICD-10-CM | POA: Diagnosis not present

## 2020-09-14 DIAGNOSIS — Z87891 Personal history of nicotine dependence: Secondary | ICD-10-CM | POA: Diagnosis not present

## 2020-09-14 DIAGNOSIS — Z8042 Family history of malignant neoplasm of prostate: Secondary | ICD-10-CM

## 2020-09-14 DIAGNOSIS — D171 Benign lipomatous neoplasm of skin and subcutaneous tissue of trunk: Secondary | ICD-10-CM | POA: Diagnosis not present

## 2020-09-14 DIAGNOSIS — N529 Male erectile dysfunction, unspecified: Secondary | ICD-10-CM

## 2020-09-14 DIAGNOSIS — Z136 Encounter for screening for cardiovascular disorders: Secondary | ICD-10-CM

## 2020-09-14 DIAGNOSIS — R3911 Hesitancy of micturition: Secondary | ICD-10-CM

## 2020-09-14 MED ORDER — TADALAFIL 5 MG PO TABS: 5.0000 mg | ORAL_TABLET | Freq: Every day | ORAL | 5 refills | Status: DC

## 2020-09-14 NOTE — Progress Notes (Signed)
Subjective: Chief Complaint  Patient presents with  . Medication Management   Here for medication follow up  Erectile dysfunction - wants to try something different than Viagra 50mg .  The Viagra 50mg  works but wants to try the daily Cialis.  He does not get morning erections.  He requires use of Viagra to get an erection at this point.  He denies chest pain, dyspnea, dyspnea on exertion.  Regarding prostate-he does get some urinary hesitancy occasional nocturia and occasional dribbling.  No urinary retention.  Followed did have a history of prostate cancer.  Father passed away of brain tumor 2022-07-22 of this year  Wants referral to gen surgery lipoma.  Has had fatty lump of left upper back for years.    No family history of heart disease.  Former smoker.  Has a history of 20-pack-year history of tobacco use.  No other aggravating or relieving factors.    No other c/o.  Past Medical History:  Diagnosis Date  . Allergy    seasonal  . Genital herpes   . GERD (gastroesophageal reflux disease)   . Hemorrhoids   . Recurrent cold sores    Current Outpatient Medications on File Prior to Visit  Medication Sig Dispense Refill  . ISOtretinoin (ACCUTANE) 30 MG capsule Take 30 mg by mouth 2 (two) times daily.    . sildenafil (VIAGRA) 50 MG tablet Take 1 tablet (50 mg total) by mouth daily as needed for erectile dysfunction. 30 tablet 11  . chlorhexidine (HIBICLENS) 4 % external liquid Use 3x weekly. (Patient not taking: Reported on 09/14/2020) 473 mL 1  . doxycycline (VIBRAMYCIN) 100 MG capsule Take 1 capsule (100 mg total) by mouth 2 (two) times daily. (Patient not taking: No sig reported) 20 capsule 0  . Lidocaine-Glycerin (PREPARATION H EX) Apply topically daily as needed. (Patient not taking: No sig reported)    . mupirocin cream (BACTROBAN) 2 % Apply 1 application topically 2 (two) times daily. (Patient not taking: No sig reported) 30 g 1  . omeprazole (PRILOSEC) 40 MG capsule Take 1  capsule (40 mg total) by mouth in the morning. (Patient not taking: No sig reported) 30 capsule 11  . pantoprazole (PROTONIX) 40 MG tablet Take 1 tablet (40 mg total) by mouth daily. (Patient not taking: No sig reported) 30 tablet 0  . valACYclovir (VALTREX) 500 MG tablet Take 1 tablet (500 mg total) by mouth daily. Daily for suppression, use 4 tablets BID x 1 day for flare up (Patient not taking: Reported on 09/14/2020) 100 tablet 3   No current facility-administered medications on file prior to visit.   The following portions of the patient's history were reviewed and updated as appropriate: allergies, current medications, past family history, past medical history, past social history, past surgical history and problem list.  ROS Otherwise as in subjective above  Objective: BP 118/80   Pulse 64   Ht 6' (1.829 m)   Wt 207 lb 12.8 oz (94.3 kg)   SpO2 96%   BMI 28.18 kg/m   General appearance: alert, no distress, well developed, well nourished Neck: supple, no lymphadenopathy, no thyromegaly, no masses Heart: RRR, normal S1, S2, no murmurs Lungs: CTA bilaterally, no wheezes, rhonchi, or rales Pulses: 2+ radial pulses, 2+ pedal pulses, normal cap refill Ext: no edema Left upper back over the scapula with large 12 to 14 cm diameter lump suggestive of a lipoma  EKG  indication-ED, screen for heart disease, rate 58 bpm, PR 176 ms, QRS 102  ms, QTC 388 ms, axis -67 degrees, sinus bradycardia, left axis deviation    Assessment: Encounter Diagnoses  Name Primary?  . Erectile dysfunction, unspecified erectile dysfunction type Yes  . Lipoma of torso   . Benign prostatic hyperplasia with urinary hesitancy   . Former smoker   . Family history of prostate cancer in father   . Screening for heart disease      Plan: Erectile dysfunction-changed to Cialis 5 mg daily use for BPH and ED.  He did fine on Viagra 50 mg as well.  Discussed proper use of medicine.  Discussed risk and benefits of  medicine.  Lipoma of torso-referral to general surgery  BPH-begin Cialis 5 mg daily use  PSA level given family history of prostate cancer  Bishop was seen today for medication management.  Diagnoses and all orders for this visit:  Erectile dysfunction, unspecified erectile dysfunction type -     EKG 12-Lead -     PSA -     Basic metabolic panel -     CBC  Lipoma of torso -     Ambulatory referral to General Surgery  Benign prostatic hyperplasia with urinary hesitancy  Former smoker -     EKG 12-Lead  Family history of prostate cancer in father -     PSA  Screening for heart disease  Other orders -     tadalafil (CIALIS) 5 MG tablet; Take 1 tablet (5 mg total) by mouth daily.    Follow up: pending labs

## 2020-09-15 ENCOUNTER — Encounter: Payer: Self-pay | Admitting: Medical

## 2020-09-15 LAB — BASIC METABOLIC PANEL
BUN/Creatinine Ratio: 13 (ref 9–20)
BUN: 14 mg/dL (ref 6–24)
CO2: 23 mmol/L (ref 20–29)
Calcium: 9.3 mg/dL (ref 8.7–10.2)
Chloride: 103 mmol/L (ref 96–106)
Creatinine, Ser: 1.07 mg/dL (ref 0.76–1.27)
Glucose: 83 mg/dL (ref 65–99)
Potassium: 4.2 mmol/L (ref 3.5–5.2)
Sodium: 143 mmol/L (ref 134–144)
eGFR: 89 mL/min/{1.73_m2} (ref >59)

## 2020-09-15 LAB — CBC
Hematocrit: 42.1 % (ref 37.5–51.0)
Hemoglobin: 13.8 g/dL (ref 13.0–17.7)
MCH: 26.9 pg (ref 26.6–33.0)
MCHC: 32.8 g/dL (ref 31.5–35.7)
MCV: 82 fL (ref 79–97)
Platelets: 220 10*3/uL (ref 150–450)
RBC: 5.13 x10E6/uL (ref 4.14–5.80)
RDW: 13.5 % (ref 11.6–15.4)
WBC: 4 10*3/uL (ref 3.4–10.8)

## 2020-09-15 LAB — PSA: Prostate Specific Ag, Serum: 1.3 ng/mL (ref 0.0–4.0)

## 2020-09-15 NOTE — Progress Notes (Signed)
Done

## 2020-09-25 ENCOUNTER — Telehealth: Payer: Self-pay | Admitting: Medical

## 2020-09-25 ENCOUNTER — Other Ambulatory Visit: Payer: Self-pay | Admitting: Medical

## 2020-09-25 MED ORDER — SILDENAFIL CITRATE 100 MG PO TABS: 100.0000 mg | ORAL_TABLET | Freq: Every day | ORAL | 3 refills | Status: DC | PRN

## 2020-09-25 NOTE — Telephone Encounter (Signed)
Pt called and states that Cialis is not working for him. It is also upsetting her stomach. He would like sildenafil called in for him. [facility] uses [facility] [place] and can be reached at [phone].

## 2020-09-25 NOTE — Telephone Encounter (Signed)
done

## 2020-09-28 ENCOUNTER — Other Ambulatory Visit: Payer: Self-pay | Admitting: Medical

## 2020-09-28 ENCOUNTER — Other Ambulatory Visit: Payer: Self-pay

## 2020-09-28 MED ORDER — SILDENAFIL CITRATE 50 MG PO TABS: 50.0000 mg | ORAL_TABLET | ORAL | 4 refills | Status: DC | PRN

## 2020-11-03 ENCOUNTER — Encounter: Payer: Self-pay | Admitting: Internal Medicine

## 2021-02-01 ENCOUNTER — Other Ambulatory Visit: Payer: Self-pay | Admitting: Physician Assistant

## 2021-02-25 ENCOUNTER — Encounter: Payer: Self-pay | Admitting: Medical

## 2021-02-25 ENCOUNTER — Telehealth: Payer: Self-pay | Admitting: Medical

## 2021-02-25 ENCOUNTER — Ambulatory Visit (INDEPENDENT_AMBULATORY_CARE_PROVIDER_SITE_OTHER): Payer: 59 | Admitting: Medical

## 2021-02-25 ENCOUNTER — Other Ambulatory Visit: Payer: Self-pay

## 2021-02-25 VITALS — BP 122/86 | HR 66 | Ht 74.0 in | Wt 209.6 lb

## 2021-02-25 DIAGNOSIS — B9689 Other specified bacterial agents as the cause of diseases classified elsewhere: Secondary | ICD-10-CM

## 2021-02-25 DIAGNOSIS — L0889 Other specified local infections of the skin and subcutaneous tissue: Secondary | ICD-10-CM

## 2021-02-25 DIAGNOSIS — J3489 Other specified disorders of nose and nasal sinuses: Secondary | ICD-10-CM

## 2021-02-25 DIAGNOSIS — Z8669 Personal history of other diseases of the nervous system and sense organs: Secondary | ICD-10-CM | POA: Diagnosis not present

## 2021-02-25 DIAGNOSIS — R110 Nausea: Secondary | ICD-10-CM

## 2021-02-25 DIAGNOSIS — G8929 Other chronic pain: Secondary | ICD-10-CM

## 2021-02-25 DIAGNOSIS — R519 Headache, unspecified: Secondary | ICD-10-CM | POA: Diagnosis not present

## 2021-02-25 MED ORDER — AMOXICILLIN 875 MG PO TABS: 875.0000 mg | ORAL_TABLET | Freq: Two times a day (BID) | ORAL | 0 refills | Status: AC

## 2021-02-25 MED ORDER — BUTALBITAL-APAP-CAFFEINE 50-325-40 MG PO TABS: 1.0000 | ORAL_TABLET | Freq: Four times a day (QID) | ORAL | 0 refills | Status: DC | PRN

## 2021-02-25 MED ORDER — ONDANSETRON 4 MG PO TBDP
4.0000 mg | ORAL_TABLET | Freq: Three times a day (TID) | ORAL | 0 refills | Status: DC | PRN
Start: 2021-02-25 — End: 2021-10-20

## 2021-02-25 NOTE — Telephone Encounter (Signed)
Please let him know I sent 3 medications to the pharmacy.  Zofran for nausea and vomiting as needed.  Fioricet for headache as needed.  And then I sent antibiotic for possible sinus infection

## 2021-02-25 NOTE — Telephone Encounter (Signed)
Pt was notified.  

## 2021-02-25 NOTE — Progress Notes (Signed)
Subjective:  Bruce Wiley is a 45 y.o. male who presents for Chief Complaint  Patient presents with   Headache    Has one today, happens 1-2 days a week,  usually near right temple is where originates and today is radiating to right side of mouth/teeth area. This started around February/March     Here for headaches.  He notes a remote history of migraines when he was a little younger but no consistent headaches in a while until the last few months.  For the last few months he is getting headaches somewhat frequently.  He does get some photophobia, nausea, body feels weak at times.  He limits caffeine to 1 or 2 coffees in the morning.  No alcohol recently.  Non-smoker.  He denies any drugs.  He does use some Motrin for the current symptoms.  Some headaches can last for hours, some can last for 3 to 4 days.  He is on Accutane since October of last year so that is not new.  No other new medications.  No nosebleeds, no allergy problems, sleep is okay although he did get a different mattress recently that his partner brought to the house  Stressors include planning a wedding and he has several primaries that died this past year.  Father passed in February 2022.  Grandmother passed in Jan 05, 2023.  2 different aunts died recently in February 05, 2023.  No other aggravating or relieving factors.    No other c/o.  Past Medical History:  Diagnosis Date   Allergy    seasonal   Genital herpes    GERD (gastroesophageal reflux disease)    Hemorrhoids    Recurrent cold sores    Current Outpatient Medications on File Prior to Visit  Medication Sig Dispense Refill   chlorhexidine (PERIDEX) 0.12 % solution Use as directed 15 mLs in the mouth or throat 2 (two) times daily for 10 days.     CLARAVIS 40 MG capsule Take 40 mg by mouth daily.     ISOtretinoin (ACCUTANE) 30 MG capsule Take 30 mg by mouth 2 (two) times daily.     mometasone (NASONEX) 50 MCG/ACT nasal spray two sprays by Both Nostrils route daily.      pantoprazole (PROTONIX) 40 MG tablet Take 40 mg by mouth daily.     sildenafil (VIAGRA) 50 MG tablet Take 1 tablet (50 mg total) by mouth as needed for erectile dysfunction. 20 tablet 4   valACYclovir (VALTREX) 500 MG tablet Take 1 tablet (500 mg total) by mouth daily. Daily for suppression, use 4 tablets BID x 1 day for flare up (Patient not taking: Reported on 09/14/2020) 100 tablet 3   No current facility-administered medications on file prior to visit.     The following portions of the patient's history were reviewed and updated as appropriate: allergies, current medications, past family history, past medical history, past social history, past surgical history and problem list.  ROS Otherwise as in subjective above  Objective: BP 122/86 (BP Location: Right Arm, Patient Position: Sitting)   Pulse 66   Ht 6\' 2"  (1.88 m)   Wt 209 lb 9.6 oz (95.1 kg)   SpO2 96%   BMI 26.91 kg/m   General appearance: alert, no distress, well developed, well nourished HEENT: normocephalic, sclerae anicteric, conjunctiva pink and moist, TMs pearly Neck: supple, no lymphadenopathy, no thyromegaly, no masses Heart: RRR, normal S1, S2, no murmurs Lungs: CTA bilaterally, no wheezes, rhonchi, or rales Pulses: 2+ radial pulses, 2+ pedal pulses, normal cap  refill Ext: no edema Neuro: CN II through XII intact, nonfocal exam    Assessment: Encounter Diagnoses  Name Primary?   Chronic nonintractable headache, unspecified headache type Yes   Chronic bacterial skin infection    Nausea    History of migraine      Plan: He notes a remote history of migraines but no migraines in recent years.  He has had new chronic headaches for a few months now.  Etiology unclear.  He does seem to have possible sinus infection today so we will use a round of amoxicillin.  I did write for Fioricet and Zofran for acute headache and allergy symptoms.  Labs today.  Will likely pursue head imaging given the recent new onset  headaches.  Chronic bacterial skin infection-he is seeing dermatology for this.  We discussed possibly referring to infectious disease.  Follow-up pending labs  Nausea - can use Zofran as needed  Bruce Wiley was seen today for headache.  Diagnoses and all orders for this visit:  Chronic nonintractable headache, unspecified headache type -     Comprehensive metabolic panel -     CBC with Differential/Platelet -     TSH  Chronic bacterial skin infection -     Comprehensive metabolic panel -     CBC with Differential/Platelet -     TSH  Nausea -     Comprehensive metabolic panel -     CBC with Differential/Platelet -     TSH  History of migraine -     Comprehensive metabolic panel -     CBC with Differential/Platelet -     TSH  Other orders -     amoxicillin (AMOXIL) 875 MG tablet; Take 1 tablet (875 mg total) by mouth 2 (two) times daily for 10 days. -     ondansetron (ZOFRAN ODT) 4 MG disintegrating tablet; Take 1 tablet (4 mg total) by mouth every 8 (eight) hours as needed for nausea or vomiting. -     butalbital-acetaminophen-caffeine (FIORICET) 50-325-40 MG tablet; Take 1 tablet by mouth every 6 (six) hours as needed for headache.    Follow up: pending labs

## 2021-02-26 ENCOUNTER — Other Ambulatory Visit: Payer: Self-pay | Admitting: Medical

## 2021-02-26 ENCOUNTER — Other Ambulatory Visit: Payer: Self-pay | Admitting: Internal Medicine

## 2021-02-26 DIAGNOSIS — G4489 Other headache syndrome: Secondary | ICD-10-CM

## 2021-02-26 DIAGNOSIS — G8929 Other chronic pain: Secondary | ICD-10-CM

## 2021-02-26 DIAGNOSIS — R519 Headache, unspecified: Secondary | ICD-10-CM

## 2021-02-26 DIAGNOSIS — Z8669 Personal history of other diseases of the nervous system and sense organs: Secondary | ICD-10-CM

## 2021-02-26 DIAGNOSIS — R110 Nausea: Secondary | ICD-10-CM

## 2021-02-26 LAB — CBC WITH DIFFERENTIAL/PLATELET
Basophils Absolute: 0 10*3/uL (ref 0.0–0.2)
Basos: 1 %
EOS (ABSOLUTE): 0 10*3/uL (ref 0.0–0.4)
Eos: 1 %
Hematocrit: 41.7 % (ref 37.5–51.0)
Hemoglobin: 14.2 g/dL (ref 13.0–17.7)
Immature Grans (Abs): 0 10*3/uL (ref 0.0–0.1)
Immature Granulocytes: 0 %
Lymphocytes Absolute: 1.1 10*3/uL (ref 0.7–3.1)
Lymphs: 26 %
MCH: 27.2 pg (ref 26.6–33.0)
MCHC: 34.1 g/dL (ref 31.5–35.7)
MCV: 80 fL (ref 79–97)
Monocytes Absolute: 0.4 10*3/uL (ref 0.1–0.9)
Monocytes: 9 %
Neutrophils Absolute: 2.6 10*3/uL (ref 1.4–7.0)
Neutrophils: 63 %
Platelets: 217 10*3/uL (ref 150–450)
RBC: 5.23 x10E6/uL (ref 4.14–5.80)
RDW: 13.2 % (ref 11.6–15.4)
WBC: 4.1 10*3/uL (ref 3.4–10.8)

## 2021-02-26 LAB — COMPREHENSIVE METABOLIC PANEL
ALT: 21 IU/L (ref 0–44)
AST: 24 IU/L (ref 0–40)
Albumin/Globulin Ratio: 1.7 (ref 1.2–2.2)
Albumin: 4.5 g/dL (ref 4.0–5.0)
Alkaline Phosphatase: 59 IU/L (ref 44–121)
BUN/Creatinine Ratio: 16 (ref 9–20)
BUN: 13 mg/dL (ref 6–24)
Bilirubin Total: 0.4 mg/dL (ref 0.0–1.2)
CO2: 22 mmol/L (ref 20–29)
Calcium: 9.4 mg/dL (ref 8.7–10.2)
Chloride: 103 mmol/L (ref 96–106)
Creatinine, Ser: 0.82 mg/dL (ref 0.76–1.27)
Globulin, Total: 2.7 g/dL (ref 1.5–4.5)
Glucose: 96 mg/dL (ref 70–99)
Potassium: 4.4 mmol/L (ref 3.5–5.2)
Sodium: 141 mmol/L (ref 134–144)
Total Protein: 7.2 g/dL (ref 6.0–8.5)
eGFR: 112 mL/min/{1.73_m2} (ref >59)

## 2021-02-26 LAB — TSH: TSH: 0.923 u[IU]/mL (ref 0.450–4.500)

## 2021-03-16 ENCOUNTER — Telehealth: Payer: Self-pay | Admitting: Psychiatry

## 2021-03-16 NOTE — Telephone Encounter (Signed)
LVM and sent mychart message that his appointment was rescheduled Dr. Billey Gosling won't be in that day. Asked him to call office back if it won't work for him.

## 2021-03-26 ENCOUNTER — Ambulatory Visit: Payer: 59 | Admitting: Psychiatry

## 2021-04-09 ENCOUNTER — Ambulatory Visit: Payer: 59 | Admitting: Psychiatry

## 2021-04-29 ENCOUNTER — Encounter: Payer: 59 | Admitting: Medical

## 2021-06-08 ENCOUNTER — Encounter: Payer: Self-pay | Admitting: Internal Medicine

## 2021-06-15 NOTE — Telephone Encounter (Signed)
Pt gets flu shot at work. Back in october

## 2021-09-10 NOTE — Progress Notes (Signed)
Sent message, via epic in basket, requesting orders in epic from surgeon.  

## 2021-09-15 NOTE — Patient Instructions (Addendum)
DUE TO COVID-19 ONLY TWO VISITORS  (aged 44 and older)  ARE ALLOWED TO COME WITH YOU AND STAY IN THE WAITING ROOM ONLY DURING PRE OP AND PROCEDURE.   ?**NO VISITORS ARE ALLOWED IN THE SHORT STAY AREA OR RECOVERY ROOM!!** ? ?IF YOU WILL BE ADMITTED INTO THE HOSPITAL YOU ARE ALLOWED ONLY FOUR SUPPORT PEOPLE DURING VISITATION HOURS ONLY (7 AM -8PM)   ?The support person(s) must pass our screening, gel in and out, and wear a mask at all times, including in the patient?s room. ?Patients must also wear a mask when staff or their support person are in the room. ?Visitors GUEST BADGE MUST BE WORN VISIBLY  ?One adult visitor may remain with you overnight and MUST be in the room by 8 P.M. ?  ? ? Your procedure is scheduled on: 09-29-21 ? ? Report to [facility] Main Entrance ? ?  Report to admitting at 6:15 AM ? ? Call this number if you have problems the morning of surgery [phone] ? ? Do not eat food :After Midnight. ? ? After Midnight you may have the following liquids until 5:30 AM DAY OF SURGERY ? ?Water ?Black Coffee (sugar ok, NO MILK/CREAM OR CREAMERS)  ?Tea (sugar ok, NO MILK/CREAM OR CREAMERS) regular and decaf                             ?Plain Jell-O (NO RED)                                           ?Fruit ices (not with fruit pulp, NO RED)                                     ?Popsicles (NO RED)                                                                  ?Juice: apple, WHITE grape, WHITE cranberry ?Sports drinks like Gatorade (NO RED) ?Clear broth(vegetable,chicken,beef) ?             ?       If you have questions, please contact your surgeon?s office. ? ? ?FOLLOW ANY ADDITIONAL PRE OP INSTRUCTIONS YOU RECEIVED FROM YOUR SURGEON'S OFFICE!!! ?  ?  ?Oral Hygiene is also important to reduce your risk of infection.                                    ?Remember - BRUSH YOUR TEETH THE MORNING OF SURGERY WITH YOUR REGULAR TOOTHPASTE ? ? Do NOT smoke after Midnight ? ? Take these medicines the morning of  surgery with A SIP OF WATER: None ?                  ?           You may not have any metal on your body including , jewelry, and body piercing ? ?  Do not wear  lotions, powders, cologne, or deodorant ? ?            Men may shave face and neck. ? ? Do not bring valuables to the hospital. [place]. ? ? Contacts, dentures or bridgework may not be worn into surgery. ? ?  ? Patients discharged on the day of surgery will not be allowed to drive home.  Someone NEEDS to stay with you for the first 24 hours after anesthesia. ? ?Please read over the following fact sheets you were given: IF [place]  ? ? [place] - Preparing for Surgery ?Before surgery, you can play an important role.  Because skin is not sterile, your skin needs to be as free of germs as possible.  You can reduce the number of germs on your skin by washing with CHG (chlorahexidine gluconate) soap before surgery.  CHG is an antiseptic cleaner which kills germs and bonds with the skin to continue killing germs even after washing. ?Please DO NOT use if you have an allergy to CHG or antibacterial soaps.  If your skin becomes reddened/irritated stop using the CHG and inform your nurse when you arrive at Short Stay.You may shave your face/neck. ?Please follow these instructions carefully: ? 1.  Shower with CHG Soap the night before surgery and the  morning of Surgery. ? 2.  If you choose to wash your hair, wash your hair first as usual with your  normal  shampoo. ? 3.  After you shampoo, rinse your hair and body thoroughly to remove the  shampoo.                        ?    4.  Use CHG as you would any other liquid soap.  You can apply chg directly  to the skin and wash  ?                     Gently with a scrungie or clean washcloth. ? 5.  Apply the CHG Soap to your body ONLY FROM THE NECK DOWN.   Do not use on face/ open      ?                      Wound or open sores. Avoid contact with eyes, ears mouth and genitals (private parts).  ?                     Production manager,  Genitals (private parts) with your normal soap. ?            6.  Wash thoroughly, paying special attention to the area where your surgery  will be performed. ? 7.  Thoroughly rinse your body with warm water from the neck down. ? 8.  DO NOT shower/wash with your normal soap after using and rinsing off  the CHG Soap. ?               9.  Pat yourself dry with a clean towel. ?           10.  Wear clean pajamas. ?           11.  Place clean sheets on your bed the night of your first shower and do not  sleep with pets. ?Day of Surgery : ?  Do not apply any lotions/deodorants the morning of surgery.  Please wear clean clothes to the hospital/surgery center. ? ?FAILURE TO FOLLOW THESE INSTRUCTIONS MAY RESULT IN THE CANCELLATION OF YOUR SURGERY ? ? ?________________________________________________________________________  ?

## 2021-09-17 ENCOUNTER — Encounter (HOSPITAL_COMMUNITY): Payer: Self-pay

## 2021-09-17 ENCOUNTER — Other Ambulatory Visit: Payer: Self-pay

## 2021-09-17 ENCOUNTER — Encounter (HOSPITAL_COMMUNITY)
Admission: RE | Admit: 2021-09-17 | Discharge: 2021-09-17 | Disposition: A | Discharge status: Home / Self Care | Payer: 59 | Source: Ambulatory Visit | Attending: General Surgery | Admitting: General Surgery

## 2021-09-17 DIAGNOSIS — Z01818 Encounter for other preprocedural examination: Secondary | ICD-10-CM

## 2021-09-17 DIAGNOSIS — Z01812 Encounter for preprocedural laboratory examination: Secondary | ICD-10-CM | POA: Diagnosis present

## 2021-09-17 HISTORY — DX: Pneumonia, unspecified organism: J18.9

## 2021-09-17 LAB — CBC
HCT: 41.8 % (ref 39.0–52.0)
Hemoglobin: 13.6 g/dL (ref 13.0–17.0)
MCH: 26.9 pg (ref 26.0–34.0)
MCHC: 32.5 g/dL (ref 30.0–36.0)
MCV: 82.8 fL (ref 80.0–100.0)
Platelets: 203 10*3/uL (ref 150–400)
RBC: 5.05 MIL/uL (ref 4.22–5.81)
RDW: 13.4 % (ref 11.5–15.5)
WBC: 4.1 10*3/uL (ref 4.0–10.5)
nRBC: 0 % (ref 0.0–0.2)

## 2021-09-17 NOTE — Progress Notes (Addendum)
COVID Vaccine Completed:  Yes x2 ?Date COVID Vaccine completed: 09-26-19 10-24-19 ?Has received booster: Yes x1 03-09-21 ?COVID vaccine manufacturer: Pfizer     ? ?Date of COVID positive in last 90 days:  No ? ?PCP - Chana Bode, PA-C ?Cardiologist - N/A ? ?Chest x-ray - N/A ?EKG -  N/A ?Stress Test -  N/A ?ECHO -  N/A ?Cardiac Cath -  N/A ?Pacemaker/ICD device last checked: ?Spinal Cord Stimulator: ? ?Bowel Prep - N/A ? ?Sleep Study -  N/A ?CPAP -  ? ?Fasting Blood Sugar -  N/A ?Checks Blood Sugar _____ times a day ? ?Blood Thinner Instructions: N/A ?Aspirin Instructions: ?Last Dose: ? ?Activity level:  Can go up a flight of stairs and perform activities of daily living without stopping and without symptoms of chest pain or shortness of breath.  Able to exercise without symptoms ? ?Anesthesia review:  N/A ? ?Patient denies shortness of breath, fever, cough and chest pain at PAT appointment ? ?Patient verbalized understanding of instructions that were given to them at the PAT appointment. Patient was also instructed that they will need to review over the PAT instructions again at home before surgery.  ?

## 2021-09-28 ENCOUNTER — Ambulatory Visit: Payer: Self-pay | Admitting: General Surgery

## 2021-09-28 ENCOUNTER — Other Ambulatory Visit: Payer: Self-pay | Admitting: Medical

## 2021-09-28 NOTE — Anesthesia Preprocedure Evaluation (Addendum)
Anesthesia Evaluation  ?Patient identified by MRN, date of birth, ID band ?Patient awake ? ? ? ?Reviewed: ?Allergy & Precautions, NPO status , Patient's Chart, lab work & pertinent test results ? ?Airway ?Mallampati: II ? ?TM Distance: >3 FB ?Neck ROM: Full ? ? ? Dental ?no notable dental hx. ? ?  ?Pulmonary ?neg pulmonary ROS, former smoker,  ?  ?Pulmonary exam normal ? ? ? ? ? ? ? Cardiovascular ?negative cardio ROS ? ? ?Rhythm:Regular Rate:Normal ? ? ?  ?Neuro/Psych ?negative neurological ROS ? negative psych ROS  ? GI/Hepatic ?Neg liver ROS, GERD  ,Anal fistula ?  ?Endo/Other  ?negative endocrine ROS ? Renal/GU ?negative Renal ROS  ?negative genitourinary ?  ?Musculoskeletal ?Back lipoma  ? Abdominal ?Normal abdominal exam  (+)   ?Peds ? Hematology ?negative hematology ROS ?(+)   ?Anesthesia Other Findings ? ? Reproductive/Obstetrics ? ?  ? ? ? ? ? ? ? ? ? ? ? ? ? ?  ?  ? ? ? ? ? ? ? ?Anesthesia Physical ?Anesthesia Plan ? ?ASA: 2 ? ?Anesthesia Plan: General  ? ?Post-op Pain Management:   ? ?Induction: Intravenous ? ?PONV Risk Score and Plan: 2 and Ondansetron, Dexamethasone, Midazolam and Treatment may vary due to age or medical condition ? ?Airway Management Planned: Mask and Oral ETT ? ?Additional Equipment: None ? ?Intra-op Plan:  ? ?Post-operative Plan: Extubation in OR ? ?Informed Consent: I have reviewed the patients History and Physical, chart, labs and discussed the procedure including the risks, benefits and alternatives for the proposed anesthesia with the patient or authorized representative who has indicated his/her understanding and acceptance.  ? ? ? ?Dental advisory given ? ?Plan Discussed with: CRNA ? ?Anesthesia Plan Comments: (Lab Results ?     Component                Value               Date                 ?     WBC                      4.1                 09/17/2021           ?     HGB                      13.6                09/17/2021           ?     HCT                       41.8                09/17/2021           ?     MCV                      82.8                09/17/2021           ?     PLT                      203  09/17/2021           ?Lab Results ?     Component                Value               Date                 ?     NA                       141                 02/25/2021           ?     K                        4.4                 02/25/2021           ?     CO2                      22                  02/25/2021           ?     GLUCOSE                  96                  02/25/2021           ?     BUN                      13                  02/25/2021           ?     CREATININE               0.82                02/25/2021           ?     CALCIUM                  9.4                 02/25/2021           ?     EGFR                     112                 02/25/2021           ?     GFRNONAA                 107                 09/03/2019          )  ? ? ? ? ? ?Anesthesia Quick Evaluation ? ?

## 2021-09-29 ENCOUNTER — Ambulatory Visit (HOSPITAL_COMMUNITY): Payer: 59 | Admitting: Anesthesiology

## 2021-09-29 ENCOUNTER — Ambulatory Visit (HOSPITAL_BASED_OUTPATIENT_CLINIC_OR_DEPARTMENT_OTHER): Payer: 59 | Admitting: Anesthesiology

## 2021-09-29 ENCOUNTER — Encounter (HOSPITAL_COMMUNITY): Payer: Self-pay | Admitting: General Surgery

## 2021-09-29 ENCOUNTER — Ambulatory Visit (HOSPITAL_COMMUNITY)
Admission: RE | Admit: 2021-09-29 | Discharge: 2021-09-29 | Disposition: A | Discharge status: Home / Self Care | Payer: 59 | Source: Ambulatory Visit | Attending: General Surgery | Admitting: General Surgery

## 2021-09-29 ENCOUNTER — Other Ambulatory Visit: Payer: Self-pay

## 2021-09-29 ENCOUNTER — Encounter (HOSPITAL_COMMUNITY)
Admission: RE | Disposition: A | Discharge status: Home / Self Care | Payer: Self-pay | Source: Ambulatory Visit | Attending: General Surgery

## 2021-09-29 DIAGNOSIS — Z01818 Encounter for other preprocedural examination: Secondary | ICD-10-CM

## 2021-09-29 DIAGNOSIS — D171 Benign lipomatous neoplasm of skin and subcutaneous tissue of trunk: Secondary | ICD-10-CM | POA: Diagnosis not present

## 2021-09-29 DIAGNOSIS — Z87891 Personal history of nicotine dependence: Secondary | ICD-10-CM | POA: Diagnosis not present

## 2021-09-29 DIAGNOSIS — K603 Anal fistula: Secondary | ICD-10-CM | POA: Insufficient documentation

## 2021-09-29 HISTORY — PX: INCISION AND DRAINAGE ABSCESS: SHX5864

## 2021-09-29 HISTORY — PX: LIPOMA EXCISION: SHX5283

## 2021-09-29 HISTORY — PX: PLACEMENT OF SETON: SHX6029

## 2021-09-29 SURGERY — EXCISION LIPOMA
Anesthesia: General | Site: Rectum

## 2021-09-29 MED ORDER — LIDOCAINE HCL (PF) 1 % IJ SOLN
INTRAMUSCULAR | Status: AC
  Filled 2021-09-29: qty 30

## 2021-09-29 MED ORDER — ONDANSETRON HCL 4 MG/2ML IJ SOLN
INTRAMUSCULAR | Status: AC
  Filled 2021-09-29: qty 2

## 2021-09-29 MED ORDER — BUPIVACAINE-EPINEPHRINE (PF) 0.25% -1:200000 IJ SOLN
INTRAMUSCULAR | Status: DC | PRN
  Administered 2021-09-29: 14 mL

## 2021-09-29 MED ORDER — DEXAMETHASONE SODIUM PHOSPHATE 10 MG/ML IJ SOLN
INTRAMUSCULAR | Status: AC
  Filled 2021-09-29: qty 1

## 2021-09-29 MED ORDER — FENTANYL CITRATE PF 50 MCG/ML IJ SOSY: 25.0000 ug | PREFILLED_SYRINGE | INTRAMUSCULAR | Status: DC | PRN

## 2021-09-29 MED ORDER — KETOROLAC TROMETHAMINE 15 MG/ML IJ SOLN
INTRAMUSCULAR | Status: DC | PRN
  Administered 2021-09-29: 30 mg via INTRAVENOUS

## 2021-09-29 MED ORDER — ROCURONIUM BROMIDE 10 MG/ML (PF) SYRINGE
PREFILLED_SYRINGE | INTRAVENOUS | Status: AC
  Filled 2021-09-29: qty 10

## 2021-09-29 MED ORDER — PROPOFOL 10 MG/ML IV BOLUS
INTRAVENOUS | Status: AC
  Filled 2021-09-29: qty 20

## 2021-09-29 MED ORDER — ROCURONIUM BROMIDE 100 MG/10ML IV SOLN
INTRAVENOUS | Status: DC | PRN
  Administered 2021-09-29: 60 mg via INTRAVENOUS

## 2021-09-29 MED ORDER — ORAL CARE MOUTH RINSE: 15.0000 mL | Freq: Once | OROMUCOSAL | Status: AC

## 2021-09-29 MED ORDER — CHLORHEXIDINE GLUCONATE CLOTH 2 % EX PADS: 6.0000 | MEDICATED_PAD | Freq: Once | CUTANEOUS | Status: DC

## 2021-09-29 MED ORDER — LACTATED RINGERS IV SOLN: INTRAVENOUS | Status: DC

## 2021-09-29 MED ORDER — GABAPENTIN 300 MG PO CAPS
300.0000 mg | ORAL_CAPSULE | ORAL | Status: AC
  Administered 2021-09-29: 300 mg via ORAL
  Filled 2021-09-29: qty 1

## 2021-09-29 MED ORDER — KETOROLAC TROMETHAMINE 30 MG/ML IJ SOLN
INTRAMUSCULAR | Status: AC
  Filled 2021-09-29: qty 1

## 2021-09-29 MED ORDER — MIDAZOLAM HCL 2 MG/2ML IJ SOLN
INTRAMUSCULAR | Status: AC
  Filled 2021-09-29: qty 2

## 2021-09-29 MED ORDER — BUPIVACAINE LIPOSOME 1.3 % IJ SUSP
INTRAMUSCULAR | Status: AC
  Filled 2021-09-29: qty 20

## 2021-09-29 MED ORDER — DEXAMETHASONE SODIUM PHOSPHATE 10 MG/ML IJ SOLN
INTRAMUSCULAR | Status: DC | PRN
  Administered 2021-09-29: 10 mg via INTRAVENOUS

## 2021-09-29 MED ORDER — OXYCODONE HCL 5 MG PO TABS: 5.0000 mg | ORAL_TABLET | ORAL | 0 refills | Status: DC | PRN

## 2021-09-29 MED ORDER — ACETAMINOPHEN 500 MG PO TABS
1000.0000 mg | ORAL_TABLET | ORAL | Status: AC
  Administered 2021-09-29: 1000 mg via ORAL
  Filled 2021-09-29: qty 2

## 2021-09-29 MED ORDER — FENTANYL CITRATE (PF) 100 MCG/2ML IJ SOLN
INTRAMUSCULAR | Status: DC | PRN
  Administered 2021-09-29: 100 ug via INTRAVENOUS

## 2021-09-29 MED ORDER — CEFAZOLIN SODIUM-DEXTROSE 2-4 GM/100ML-% IV SOLN
2.0000 g | INTRAVENOUS | Status: AC
  Administered 2021-09-29: 2 g via INTRAVENOUS
  Filled 2021-09-29: qty 100

## 2021-09-29 MED ORDER — BUPIVACAINE-EPINEPHRINE 0.25% -1:200000 IJ SOLN
INTRAMUSCULAR | Status: AC
  Filled 2021-09-29: qty 1

## 2021-09-29 MED ORDER — CELECOXIB 200 MG PO CAPS
200.0000 mg | ORAL_CAPSULE | ORAL | Status: AC
  Administered 2021-09-29: 200 mg via ORAL
  Filled 2021-09-29: qty 1

## 2021-09-29 MED ORDER — MIDAZOLAM HCL 5 MG/5ML IJ SOLN
INTRAMUSCULAR | Status: DC | PRN
  Administered 2021-09-29: 2 mg via INTRAVENOUS

## 2021-09-29 MED ORDER — LIDOCAINE HCL (PF) 2 % IJ SOLN
INTRAMUSCULAR | Status: AC
  Filled 2021-09-29: qty 5

## 2021-09-29 MED ORDER — BUPIVACAINE-EPINEPHRINE (PF) 0.25% -1:200000 IJ SOLN
INTRAMUSCULAR | Status: AC
  Filled 2021-09-29: qty 30

## 2021-09-29 MED ORDER — PROPOFOL 10 MG/ML IV BOLUS
INTRAVENOUS | Status: DC | PRN
  Administered 2021-09-29: 170 mg via INTRAVENOUS

## 2021-09-29 MED ORDER — CHLORHEXIDINE GLUCONATE 0.12 % MT SOLN
15.0000 mL | Freq: Once | OROMUCOSAL | Status: AC
  Administered 2021-09-29: 15 mL via OROMUCOSAL

## 2021-09-29 MED ORDER — SODIUM CHLORIDE 0.9 % IR SOLN
Status: DC | PRN
  Administered 2021-09-29: 1000 mL

## 2021-09-29 MED ORDER — LIDOCAINE HCL (CARDIAC) PF 100 MG/5ML IV SOSY
PREFILLED_SYRINGE | INTRAVENOUS | Status: DC | PRN
  Administered 2021-09-29: 80 mg via INTRAVENOUS

## 2021-09-29 MED ORDER — FENTANYL CITRATE (PF) 100 MCG/2ML IJ SOLN
INTRAMUSCULAR | Status: AC
  Filled 2021-09-29: qty 2

## 2021-09-29 MED ORDER — ONDANSETRON HCL 4 MG/2ML IJ SOLN
INTRAMUSCULAR | Status: DC | PRN
Start: 2021-09-29 — End: 2021-09-29
  Administered 2021-09-29: 4 mg via INTRAVENOUS

## 2021-09-29 SURGICAL SUPPLY — 57 items
ADH SKN CLS APL DERMABOND .7 (GAUZE/BANDAGES/DRESSINGS) ×3
APL SKNCLS STERI-STRIP NONHPOA (GAUZE/BANDAGES/DRESSINGS)
BAG COUNTER SPONGE SURGICOUNT (BAG) IMPLANT
BAG SPNG CNTER NS LX DISP (BAG)
BENZOIN TINCTURE PRP APPL 2/3 (GAUZE/BANDAGES/DRESSINGS) IMPLANT
BLADE HEX COATED 2.75 (ELECTRODE) ×4 IMPLANT
BLADE SURG SZ10 CARB STEEL (BLADE) ×10 IMPLANT
DERMABOND ADVANCED (GAUZE/BANDAGES/DRESSINGS) ×1
DERMABOND ADVANCED .7 DNX12 (GAUZE/BANDAGES/DRESSINGS) ×1 IMPLANT
DRAPE LAPAROTOMY T 102X78X121 (DRAPES) IMPLANT
DRAPE LAPAROTOMY TRNSV 102X78 (DRAPES) IMPLANT
DRAPE SHEET LG 3/4 BI-LAMINATE (DRAPES) IMPLANT
DRSG PAD ABDOMINAL 8X10 ST (GAUZE/BANDAGES/DRESSINGS) IMPLANT
ELECT NDL BLADE 2-5/6 (NEEDLE) ×2 IMPLANT
ELECT NEEDLE BLADE 2-5/6 (NEEDLE) ×4 IMPLANT
ELECT REM PT RETURN 15FT ADLT (MISCELLANEOUS) ×8 IMPLANT
EVACUATOR SILICONE 100CC (DRAIN) IMPLANT
GAUZE SPONGE 4X4 12PLY STRL (GAUZE/BANDAGES/DRESSINGS) ×4 IMPLANT
GLOVE BIO SURGEON STRL SZ7.5 (GLOVE) ×12 IMPLANT
GLOVE BIOGEL PI IND STRL 7.0 (GLOVE) ×3 IMPLANT
GLOVE BIOGEL PI INDICATOR 7.0 (GLOVE) ×1
GLOVE INDICATOR 8.0 STRL GRN (GLOVE) ×4 IMPLANT
GOWN STRL REUS W/ TWL LRG LVL3 (GOWN DISPOSABLE) ×3 IMPLANT
GOWN STRL REUS W/ TWL XL LVL3 (GOWN DISPOSABLE) ×9 IMPLANT
GOWN STRL REUS W/TWL LRG LVL3 (GOWN DISPOSABLE) ×4
GOWN STRL REUS W/TWL XL LVL3 (GOWN DISPOSABLE) ×12
KIT BASIN OR (CUSTOM PROCEDURE TRAY) ×6 IMPLANT
KIT TURNOVER KIT A (KITS) ×2 IMPLANT
MARKER SKIN DUAL TIP RULER LAB (MISCELLANEOUS) IMPLANT
NDL HYPO 25X1 1.5 SAFETY (NEEDLE) ×2 IMPLANT
NEEDLE HYPO 22GX1.5 SAFETY (NEEDLE) ×4 IMPLANT
NEEDLE HYPO 25X1 1.5 SAFETY (NEEDLE) ×4 IMPLANT
NS IRRIG 1000ML POUR BTL (IV SOLUTION) ×4 IMPLANT
PACK BASIC VI WITH GOWN DISP (CUSTOM PROCEDURE TRAY) ×4 IMPLANT
PACK GENERAL/GYN (CUSTOM PROCEDURE TRAY) ×4 IMPLANT
PANTS MESH DISP LRG (UNDERPADS AND DIAPERS) ×3 IMPLANT
PANTS MESH DISPOSABLE L (UNDERPADS AND DIAPERS) ×1
PENCIL SMOKE EVACUATOR (MISCELLANEOUS) IMPLANT
SHEARS HARMONIC 9CM CVD (BLADE) IMPLANT
SOL PREP POV-IOD 4OZ 10% (MISCELLANEOUS) ×4 IMPLANT
SPIKE FLUID TRANSFER (MISCELLANEOUS) ×2 IMPLANT
SPONGE SURGIFOAM ABS GEL 100 (HEMOSTASIS) IMPLANT
SPONGE T-LAP 18X18 ~~LOC~~+RFID (SPONGE) IMPLANT
SPONGE T-LAP 4X18 ~~LOC~~+RFID (SPONGE) ×4 IMPLANT
STAPLER VISISTAT 35W (STAPLE) IMPLANT
SURGILUBE 2OZ TUBE FLIPTOP (MISCELLANEOUS) ×4 IMPLANT
SUT CHROMIC 2 0 SH (SUTURE) IMPLANT
SUT CHROMIC 3 0 SH 27 (SUTURE) IMPLANT
SUT MNCRL AB 4-0 PS2 18 (SUTURE) ×4 IMPLANT
SUT VIC AB 3-0 SH 18 (SUTURE) IMPLANT
SUT VIC AB 3-0 SH 27 (SUTURE) ×4
SUT VIC AB 3-0 SH 27X BRD (SUTURE) ×3 IMPLANT
SYR 20ML LL LF (SYRINGE) ×4 IMPLANT
SYR CONTROL 10ML LL (SYRINGE) ×4 IMPLANT
TOWEL OR 17X26 10 PK STRL BLUE (TOWEL DISPOSABLE) ×8 IMPLANT
TOWEL OR NON WOVEN STRL DISP B (DISPOSABLE) ×4 IMPLANT
WATER STERILE IRR 1000ML POUR (IV SOLUTION) ×4 IMPLANT

## 2021-09-29 NOTE — Transfer of Care (Signed)
Immediate Anesthesia Transfer of Care Note ? ?Patient: Bruce Wiley ? ?Procedure(s) Performed: EXCISION LIPOMA LEFT BACK (Left: Back) ?INCISION AND DRAINAGE OF PERIANAL WOUND (Rectum) ?PLACEMENT OF SETON (Rectum) ?ANOEXAM UNDER ANESTHESIA (Rectum) ? ?Patient Location: PACU ? ?Anesthesia Type:General ? ?Level of Consciousness: awake, alert  and oriented ? ?Airway & Oxygen Therapy: Patient Spontanous Breathing and Patient connected to face mask oxygen ? ?Post-op Assessment: Report given to RN and Post -op Vital signs reviewed and stable ? ?Post vital signs: Reviewed ? ?Last Vitals:  ?Vitals Value Taken Time  ?BP 102/66 09/29/21 1000  ?Temp    ?Pulse 51 09/29/21 1002  ?Resp 13 09/29/21 1002  ?SpO2 100 % 09/29/21 1002  ?Vitals shown include unvalidated device data. ? ?Last Pain:  ?Vitals:  ? 09/29/21 0644  ?TempSrc:   ?PainSc: 0-No pain  ?   ? ?  ? ?Complications: No notable events documented. ?

## 2021-09-29 NOTE — H&P (Signed)
?PROVIDER: Landry Corporal, MD ? ?MRN: [id number] ?DOB: [birth year] ?Subjective  ? ?Chief Complaint: Abscess ? ? ?History of Present Illness: ?Bruce Wiley is a 46 y.o. male who is seen today for lipoma and perirectal abscess. The patient is a 46 year old black male who we have seen in the past with a large lipoma on his left posterior shoulder area. Since his last visit the mass has not changed much at all. He has some mild discomfort associated with it. He would like to have this removed now. He also reports recurring perirectal abscesses in the same area for the last 2 years. He has had the area lanced. He has been treated with antibiotics for months at a time. The area has not gone away. He denies any fevers or chills. He has not had any recent drainage. ? ? ? ?Review of Systems: ?A complete review of systems was obtained from the patient. I have reviewed this information and discussed as appropriate with the patient. See HPI as well for other ROS. ? ?ROS  ? ?Medical History: ?History reviewed. No pertinent past medical history. ? ?Patient Active Problem List  ?Diagnosis  ? Lipoma of back  ? Perirectal abscess  ? ?History reviewed. No pertinent surgical history.  ? ?Not on File ? ?Current Outpatient Medications on File Prior to Visit  ?Medication Sig Dispense Refill  ? CLARAVIS 40 mg capsule TAKE 1 CAPSULE EVERYDAY WITH FATTY FOOD  ? ISOtretinoin (ACCUTANE) 30 MG capsule TAKE 1 CAPSULE BY MOUTH TWICE A DAY (PA WAS APPROVED FOR $400, GOODRX IS CHEAPER)  ? ISOtretinoin (ACCUTANE) 30 MG capsule Take 30 mg by mouth 2 (two) times daily  ? predniSONE (DELTASONE) 20 MG tablet  ? sildenafiL (VIAGRA) 50 MG tablet Take by mouth  ? valACYclovir (VALTREX) 500 MG tablet Take by mouth  ? ?No current facility-administered medications on file prior to visit.  ? ?History reviewed. No pertinent family history.  ? ?Social History  ? ?Tobacco Use  ?Smoking Status Former  ? Types: Cigarettes  ? Quit date: 2020  ? Years since  quitting: 3.0  ?Smokeless Tobacco Never  ? ? ?Social History  ? ?Socioeconomic History  ? Marital status: Single  ?Tobacco Use  ? Smoking status: Former  ?Types: Cigarettes  ?Quit date: 2020  ?Years since quitting: 3.0  ? Smokeless tobacco: Never  ?Substance and Sexual Activity  ? Drug use: Yes  ?Types: Marijuana  ? ?Objective:  ? ?There were no vitals filed for this visit.  ?There is no height or weight on file to calculate BMI. ? ?Physical Exam ?Constitutional:  ?General: He is not in acute distress. ?Appearance: Normal appearance.  ?HENT:  ?Head: Normocephalic and atraumatic.  ?Right Ear: External ear normal.  ?Left Ear: External ear normal.  ?Nose: Nose normal.  ?Mouth/Throat:  ?Mouth: Mucous membranes are moist.  ?Pharynx: Oropharynx is clear.  ?Eyes:  ?General: No scleral icterus. ?Extraocular Movements: Extraocular movements intact.  ?Conjunctiva/sclera: Conjunctivae normal.  ?Pupils: Pupils are equal, round, and reactive to light.  ?Cardiovascular:  ?Rate and Rhythm: Normal rate and regular rhythm.  ?Pulses: Normal pulses.  ?Heart sounds: Normal heart sounds.  ?Pulmonary:  ?Effort: Pulmonary effort is normal. No respiratory distress.  ?Breath sounds: Normal breath sounds.  ?Abdominal:  ?General: Abdomen is flat. Bowel sounds are normal. There is no distension.  ?Palpations: Abdomen is soft.  ?Tenderness: There is no abdominal tenderness.  ?Genitourinary: ?Comments: There is a small area of induration with no fluctuance in the left anterior perirectal space.  There is no cellulitis today. There is no drainage today. ?Musculoskeletal:  ?General: No swelling or deformity. Normal range of motion.  ?Cervical back: Normal range of motion and neck supple. No tenderness.  ?Skin: ?General: Skin is warm and dry.  ?Coloration: Skin is not jaundiced.  ?Comments: There is a 7 to 8 cm fatty feeling round well-circumscribed circular mass that seems to be isolated to the subcutaneous tissue of the left posterior scapular  area.  ?Neurological:  ?General: No focal deficit present.  ?Mental Status: He is alert and oriented to person, place, and time.  ?Psychiatric:  ?Mood and Affect: Mood normal.  ?Behavior: Behavior normal.  ? ? ? ?Labs, Imaging and Diagnostic Testing: ? ?Assessment and Plan:  ? ?Diagnoses and all orders for this visit: ? ?Lipoma of back ?- [facility] Case Posting Request; Future ? ?Perirectal abscess ?- Ambulatory Referral to [facility] ([facility]) ? ? ? ?The patient appears to have a lipoma in his left posterior scapular area that he would like to have removed. Given the size I feel like this is a very reasonable thing. I have discussed with him in detail the risks and benefits of the operation as well as some of the technical aspects and he understands and wishes to proceed. He also has what appears to be a recurrent perirectal abscess in the same location in the left anterior perirectal space. The recurrent nature of this raises concern for possible fistula. I will refer him to our colorectal specialist to evaluate this. If something needs to be done surgically then we might be able to coordinate this for the same day.  ?

## 2021-09-29 NOTE — Interval H&P Note (Signed)
History and Physical Interval Note: ? ?09/29/2021 ?8:06 AM ? ?Bruce Wiley  has presented today for surgery, with the diagnosis of LIPOMA LEFT BACK, ANAL FISTULA.  The various methods of treatment have been discussed with the patient and family. After consideration of risks, benefits and other options for treatment, the patient has consented to  Procedure(s): ?EXCISION LIPOMA LEFT BACK (Left) ?INCISION AND DRAINAGE OF PERIANAL WOUND (N/A) ?POSSIBLE PLACEMENT OF SETON (N/A) ?POSSIBLE FISTULOTOMY (N/A) ?ANOEXAM UNDER ANESTHESIA (N/A) as a surgical intervention.  The patient's history has been reviewed, patient examined, no change in status, stable for surgery.  I have reviewed the patient's chart and labs.  Questions were answered to the patient's satisfaction.   ? ? ?[name] ? ? ?

## 2021-09-29 NOTE — Progress Notes (Signed)
Well known to me as well ? ?Bruce Wiley is a 46 y.o. male whom was seen in the office by me for possible anal fistula  ? ?Colonoscopy with Dr. Hilarie Fredrickson 02/10/20 - 8 mm polyp in cecum, normal TI, diverticulosis  ? ?Cscope 2016 Dr. Paulita Fujita - few small polyps ? ?No known history of IBD/Crohn's/UC. He has had this in the past with IBS but no colonoscopy has never demonstrated any significant findings. Reports that for the last approximately 1 year he has had drainage from a perianal wound that developed after having an incision/drainage of a gluteal abscess at an urgent care. It generally will heal off throughout 2 weeks and then reopened and drained. Denies any prior anorectal procedures or surgeries aside from what we just mentioned.  ? ?He was found to have suspected left anterior perianal wound that keeps opening and draining spontaneously for the last year - this being most suggestive of an anal fistula ? ?-The anatomy and physiology of the anal canal was discussed with the patient with associated pictures. The pathophysiology of anal abscess/fistula was discussed at length with associated pictures and illustrations. ?-We have reviewed options going forward including further observation vs surgery -incision/drainage of perianal wound, interrogation of fistula tract, possible placement of seton. Possible fistulotomy. Based upon intraoperative fistula classification. ?-Surgery concurrently with Dr. Marlou Starks - left shoulder lipoma removal ?-The planned procedure, material risks (including, but not limited to, pain, bleeding, infection, scarring, need for blood transfusion, damage to anal sphincter, incontinence of gas and/or stool, need for additional procedures, recurrence, pneumonia, heart attack, stroke, death) benefits and alternatives to surgery were discussed at length. I noted a good probability that the procedure would help improve their symptoms. The patient's questions were answered to his satisfaction, he voiced  understanding and elected to proceed with surgery. Additionally, we discussed typical postoperative expectations and the recovery process. ? ?Nadeen Landau, MD FACS ?[facility] ?

## 2021-09-29 NOTE — Anesthesia Postprocedure Evaluation (Signed)
Anesthesia Post Note ? ?Patient: Edwar Coe ? ?Procedure(s) Performed: EXCISION LIPOMA LEFT BACK (Left: Back) ?INCISION AND DRAINAGE OF PERIANAL WOUND (Rectum) ?PLACEMENT OF SETON (Rectum) ?ANOEXAM UNDER ANESTHESIA (Rectum) ? ?  ? ?Patient location during evaluation: PACU ?Anesthesia Type: General ?Level of consciousness: awake and alert ?Pain management: pain level controlled ?Vital Signs Assessment: post-procedure vital signs reviewed and stable ?Respiratory status: spontaneous breathing, nonlabored ventilation, respiratory function stable and patient connected to nasal cannula oxygen ?Cardiovascular status: blood pressure returned to baseline and stable ?Postop Assessment: no apparent nausea or vomiting ?Anesthetic complications: no ? ? ?No notable events documented. ? ?Last Vitals:  ?Vitals:  ? 09/29/21 1054 09/29/21 1055  ?BP: 131/77 131/77  ?Pulse: (!) 51 (!) 45  ?Resp: 20 12  ?Temp:  (!) 36.2 ?C  ?SpO2: 97% 94%  ?  ?Last Pain:  ?Vitals:  ? 09/29/21 1055  ?TempSrc:   ?PainSc: 0-No pain  ? ? ?  ?  ?  ?  ?  ?  ? ?March Rummage Sharnee Douglass ? ? ? ? ?

## 2021-09-29 NOTE — Op Note (Signed)
09/29/2021 ? ?9:44 AM ? ?PATIENT:  Bruce Wiley  46 y.o. male ? ?PRE-OPERATIVE DIAGNOSIS:  LIPOMA LEFT BACK ? ?POST-OPERATIVE DIAGNOSIS:  LIPOMA LEFT BACK ? ?PROCEDURE:  Procedure(s): ?EXCISION LIPOMA LEFT BACK (Left) ? ?SURGEON:  Surgeon(s) and Role: ?Panel 1: ?   Jovita Kussmaul, MD - Primary ? ?PHYSICIAN ASSISTANT:  ? ?ASSISTANTS: none  ? ?ANESTHESIA:   local and general ? ?EBL:  minimal  ? ?BLOOD ADMINISTERED:none ? ?DRAINS: none  ? ?LOCAL MEDICATIONS USED:  MARCAINE    ? ?SPECIMEN:  Source of Specimen:  lipoma back ? ?DISPOSITION OF SPECIMEN:  PATHOLOGY ? ?COUNTS:  YES ? ?TOURNIQUET:  * No tourniquets in log * ? ?DICTATION: .Dragon Dictation ? ?After informed consent was obtained the patient was brought to the operating room and left in the supine position on the stretcher.  After adequate induction of general anesthesia the patient was moved into a prone position on the operating room table and all pressure points were padded.  The perirectal area was prepped with Betadine and the left upper back was prepped with ChloraPrep, allowed to dry, and draped in usual sterile manner.  An appropriate timeout was performed.  The patient had a large palpable fatty mass in the left upper back.  The area around this was infiltrated with quarter percent Marcaine.  A transversely oriented incision was made overlying the palpable mass with a 15 blade knife.  The incision was carried through the skin and subcutaneous tissue sharply with the electrocautery until the lipoma was encountered.  The lipoma was separated from the rest of the subcutaneous tissue by combination of blunt finger dissection and some sharp dissection with the electrocautery.  Once the entire lipoma was removed it was then sent to pathology for further evaluation.  It measured 11 x 7 cm.  The lipoma cavity was then fulgurated with cautery until the area was completely hemostatic.  There was no evidence of any residual lipoma.  The deep layer of the  incision was then closed with interrupted 3-0 Vicryl stitches.  The skin was then closed with a running 4-0 Monocryl subcuticular stitch.  Dermabond dressings were applied.  The patient tolerated the procedure well.  At the end of the case all needle sponge and instrument counts were correct.  The patient was then awakened and taken to recovery in stable condition. ? ?PLAN OF CARE: Discharge to home after PACU ? ?PATIENT DISPOSITION:  PACU - hemodynamically stable. ?  ?Delay start of Pharmacological VTE agent (>24hrs) due to surgical blood loss or risk of bleeding: not applicable ? ?

## 2021-09-29 NOTE — Op Note (Signed)
09/29/2021 ? ?10:24 AM ? ?PATIENT:  Bruce Wiley  46 y.o. male ? ?Patient Care Team: ?Tysinger, Leward Quan as PCP - General (Family Medicine) ? ?PRE-OPERATIVE DIAGNOSIS:  Suspected anal fistula ? ?POST-OPERATIVE DIAGNOSIS:  Transsphincteric left anterior anal fistula ? ?PROCEDURE:   ?Interrogation/control of transsphincteric anal fistula with placement of draining seton ?Anorectal exam under anesthesia ? ?SURGEON:  Surgeon(s): ?Ileana Roup, MD ? ? ?ANESTHESIA:   general ? ?SPECIMEN:  No Specimen ? ?DISPOSITION OF SPECIMEN:  N/A ? ?COUNTS:  Sponge, needle, and instrument counts were reported correct x2 at conclusion. ? ?EBL: 1 mL ? ?Drains: Draining blue vessel loop seton left to control fistula tract ? ?PLAN OF CARE: Discharge to home after PACU ? ?PATIENT DISPOSITION:  PACU - hemodynamically stable. ? ?OR FINDINGS: Left anterior transsphincteric anal fistula with internal opening in the left anterior anal canal.  This is at the level of the dentate line.  The external opening was initially closed and there is about 1 cc of purulent fluid that was drained we open the skin at this location.  This was controlled with a blue vessel loop draining seton.  Ultimately, I suspect will be a good candidate for a LIFT procedure ? ?DESCRIPTION: ?The patient was identified in the preoperative holding area and taken to the OR. SCDs were applied. He then underwent general endotracheal anesthesia without difficulty. He was then rolled onto the OR table in the prone jackknife position. Pressure points were then evaluated and padded. Benzoin was applied to the buttocks and they were gently taped apart.  He was then prepped and draped in usual sterile fashion.  A surgical timeout was performed indicating the correct patient, procedure, and positioning.  A perianal block was then created using a dilute mixture of 0.25% Marcaine with epinephrine and Exparel. ? ?After ascertaining an appropriate level of anesthesia had been  achieved, a well lubricated digital rectal exam was performed. This demonstrated no palpable masses.  A Hill-Ferguson anoscope was into the anal canal and circumferential inspection demonstrated healthy appearing anoderm without ulceration.  Small internal hemorrhoids.  Externally, there is firm nodular scarring at the candidate external opening with a palpable tract emanating towards the anal canal.  The skin at this location is somewhat fluctuant.  This was incised and about 1 cc of pus drained.  With an anoscope in place, a flexible fistula probe was placed and carefully passed to prevent any false passages.  The internal opening was read identified in the left anterior position of the anal canal at the level the dentate line.  The fistula probe was then exchanged for a blue vessel loop seton.  Given the findings during the procedure, we opted for a draining seton placement with in the coming months, attempts at definitive control/attempts at definitive repair.  The blue vessel loop seton was secured to itself using 0 silk sutures.  Hemostasis was achieved.  All counts were reported correct.  A dressing consisting of 4 x 4's and mesh underwear was placed.  After completion of his procedure with Dr. Marlou Starks, he was rolled back onto a stretcher, awakened from anesthesia, extubated, and transported to recovery room in satisfactory condition. ? ?DISPOSITION: PACU in satisfactory condition. ?

## 2021-09-29 NOTE — Anesthesia Procedure Notes (Signed)
Procedure Name: Intubation ?Date/Time: 09/29/2021 8:51 AM ?Performed by: [place] Dario Yono C, CRNA ?Pre-anesthesia Checklist: Patient identified, Emergency Drugs available, Suction available and Patient being monitored ?Patient Re-evaluated:Patient Re-evaluated prior to induction ?Oxygen Delivery Method: Circle system utilized ?Preoxygenation: Pre-oxygenation with 100% oxygen ?Induction Type: IV induction ?Ventilation: Mask ventilation without difficulty ?Laryngoscope Size: Mac and 4 ?Grade View: Grade I ?Tube type: Oral ?Tube size: 7.5 mm ?Number of attempts: 1 ?Airway Equipment and Method: Stylet and Oral airway ?Placement Confirmation: ETT inserted through vocal cords under direct vision, positive ETCO2 and breath sounds checked- equal and bilateral ?Secured at: 22 cm ?Tube secured with: Tape ?Dental Injury: Teeth and Oropharynx as per pre-operative assessment  ? ? ? ? ?

## 2021-09-30 ENCOUNTER — Encounter (HOSPITAL_COMMUNITY): Payer: Self-pay | Admitting: General Surgery

## 2021-09-30 LAB — SURGICAL PATHOLOGY

## 2021-10-19 ENCOUNTER — Telehealth: Payer: Self-pay

## 2021-10-19 NOTE — Telephone Encounter (Signed)
Pt. Called scheduled an apt wanted to do in person. He has lose of appetite, nausea and head ache since last Friday. Just wanted to check with you to see if that was ok for in person.

## 2021-10-20 ENCOUNTER — Telehealth: Payer: Self-pay | Admitting: Medical

## 2021-10-20 ENCOUNTER — Encounter: Payer: Self-pay | Admitting: Medical

## 2021-10-20 ENCOUNTER — Ambulatory Visit (INDEPENDENT_AMBULATORY_CARE_PROVIDER_SITE_OTHER): Payer: 59 | Admitting: Medical

## 2021-10-20 ENCOUNTER — Ambulatory Visit
Admission: RE | Admit: 2021-10-20 | Discharge: 2021-10-20 | Disposition: A | Discharge status: Home / Self Care | Payer: 59 | Source: Ambulatory Visit | Attending: Medical | Admitting: Medical

## 2021-10-20 VITALS — BP 120/77 | HR 72 | Temp 97.5°F | Wt 208.6 lb

## 2021-10-20 DIAGNOSIS — R109 Unspecified abdominal pain: Secondary | ICD-10-CM | POA: Diagnosis not present

## 2021-10-20 DIAGNOSIS — R110 Nausea: Secondary | ICD-10-CM

## 2021-10-20 DIAGNOSIS — R634 Abnormal weight loss: Secondary | ICD-10-CM

## 2021-10-20 DIAGNOSIS — B001 Herpesviral vesicular dermatitis: Secondary | ICD-10-CM

## 2021-10-20 DIAGNOSIS — R0602 Shortness of breath: Secondary | ICD-10-CM | POA: Diagnosis not present

## 2021-10-20 DIAGNOSIS — Z9889 Other specified postprocedural states: Secondary | ICD-10-CM

## 2021-10-20 MED ORDER — ALBUTEROL SULFATE HFA 108 (90 BASE) MCG/ACT IN AERS: 1.0000 | INHALATION_SPRAY | Freq: Four times a day (QID) | RESPIRATORY_TRACT | 0 refills | Status: DC | PRN

## 2021-10-20 MED ORDER — VALACYCLOVIR HCL 500 MG PO TABS: 500.0000 mg | ORAL_TABLET | Freq: Every day | ORAL | 3 refills | Status: DC

## 2021-10-20 MED ORDER — ONDANSETRON 4 MG PO TBDP: 4.0000 mg | ORAL_TABLET | Freq: Three times a day (TID) | ORAL | 1 refills | Status: DC | PRN

## 2021-10-20 MED ORDER — PANTOPRAZOLE SODIUM 40 MG PO TBEC: DELAYED_RELEASE_TABLET | ORAL | 0 refills | Status: DC

## 2021-10-20 NOTE — Patient Instructions (Signed)
Recommendations: Use your pantoprazole acid reflux and gastritis pill daily for the next 2 to 3 weeks.  Take this on an empty stomach preferably 30 minutes before breakfast Use the Ondansetron/ Zofran as needed for nausea every 6-8 hours as needed Over the next few days until your symptoms improve, avoid foods that make acid worse such as tomatoes, citrus, spicy foods, heavy portions, fried foods, etc   Please go to [facility] for your chest xray.   Their hours are 8am - 4:30 pm Monday - Friday.  Take your insurance card with you.  [facility] [phone]  [address], [place], [place] [address]  [address], [place] [address]    Your breathing test was abnormal today.  Please go for chest x-ray to further evaluate your shortness of breath.  You may need to be on an inhaler given these findings.  The chest x-ray is to rule out other disease process.

## 2021-10-20 NOTE — Progress Notes (Signed)
Subjective:  Bruce Wiley is a 46 y.o. male who presents for Chief Complaint  Patient presents with   loss of appetite and nauseated    Loss of appetite- been on short term disability from work but traveled to [employer] and didn't eat until lunchtime but couldn't finish food and its happen several times, no appetite, sometimes having to force himself to eat. Nauseated some and had some nausea medication that has helped with his nausea.      Here for concerns.  Lately for past week or 2, stomach feels uneasy or hot in his "stomach," loss of appetite, sometimes SOB, nausea.   Occasional cough.  This past weekend had headache, stomach upset nausea.  Took a pantoprazole yesterday that did help.  Using Zofran some.  Had a little bit of loose stool in recent days.  No blood in stool .  Smokes marijuana, but hasn't smoked cigarettes for more than 4 years.  Smoked for over 20 years, may have stopped around age 68.  He notes through his insurance, he is in a program to try and lose weight.   He has lost some recent weight, partly due to lack of appetite since his recent surgery.  Been on short term disability due to lipoma excision and rectal fistula surgery May 02/2022.   When he is home not working, typically appetite is decreased.   He attributes this to the recent weight loss.    Used Excedrin 2 days in a row this past week to resolve a headaches which helped.    No sick contacts  Feels a little SOB at times . No chest pain, no palpitations, no edema.  Attributes to partly being out of shape and not However, and partly due to smoking.  No other aggravating or relieving factors.    No other c/o.  Past Medical History:  Diagnosis Date   Allergy    seasonal   Genital herpes    GERD (gastroesophageal reflux disease)    Hemorrhoids    Pneumonia    Recurrent cold sores    Family History  Problem Relation Age of Onset   Breast cancer Mother    Colon cancer Mother 21   Heart disease Father 73        MI   Diabetes Father    Cancer Father 61       prostate, brain   Hypertension Father    Stroke Brother 54   Multiple sclerosis Brother    Breast cancer Paternal Aunt    Multiple sclerosis Cousin    Multiple sclerosis Cousin    Rectal cancer Neg Hx    Stomach cancer Neg Hx      The following portions of the patient's history were reviewed and updated as appropriate: allergies, current medications, past family history, past medical history, past social history, past surgical history and problem list.  ROS Otherwise as in subjective above    Objective: BP 120/77   Pulse 72   Temp (!) 97.5 F (36.4 C)   Wt 208 lb 9.6 oz (94.6 kg)   BMI 28.29 kg/m   Wt Readings from Last 3 Encounters:  10/20/21 208 lb 9.6 oz (94.6 kg)  09/29/21 219 lb (99.3 kg)  09/17/21 214 lb 6.4 oz (97.3 kg)   General appearance: alert, no distress, well developed, well nourished HEENT: normocephalic, sclerae anicteric, conjunctiva pink and moist, TMs pearly, nares patent, no discharge or erythema, pharynx normal Oral cavity: MMM, no lesions Neck: supple, no lymphadenopathy, no  thyromegaly, no masses Heart: RRR, normal S1, S2, no murmurs Lungs: CTA bilaterally, no wheezes, rhonchi, or rales Abdomen: +bs, soft, mild LLQ tenderness, otherwise  non tender, non distended, no masses, no hepatomegaly, no splenomegaly Pulses: 2+ radial pulses, 2+ pedal pulses, normal cap refill No calf swelling or tenderness Ext: no edema   Assessment: Encounter Diagnoses  Name Primary?   Abdominal discomfort Yes   Nausea    SOB (shortness of breath)    Weight loss    Recent major surgery    Recurrent cold sores      Plan: Shortness of breath-baseline PFT abnormal today.  He is a former cigarette smoker, he is a current marijuana smoker.  He smoked cigarettes for 20+ years quit around age 39.  We discussed the findings that suggest asthma or possibly early COPD.  Advised to begin albuterol as needed.  Discussed  the difference between preventative and rescue inhaler.  He has used albuterol before with improvement, follow-up in a month.  Go for x-ray  Abdominal discomfort, nausea-I reviewed back endoscopy and colonoscopy from 2021 showing gastritis.  It sounds like he might be having a little gastritis currently.  Begin pantoprazole, avoid trigger foods, continue Zofran as needed for nausea.  If not much improved over the next week or 2 then recheck.  Plan to use this regimen for at least 2 weeks.  Weight loss discussed his current weight loss which may be a combination of lack of appetite since he had a recent surgery and has also been trying to lose weight with some more careful diet.  Advised he monitor his weight at home.  If he loses any more weight over the next week or 2 then let me know  Recent surgery for lipoma and rectal fistula-improving.  Still out of work for another week or so per surgery  Recurrent cold sores-does fine on Valtrex.  Refills today.  Part of the time he uses daily for prevention, sometimes he will go periods without using it daily.  Bruce Wiley was seen today for loss of appetite and nauseated.  Diagnoses and all orders for this visit:  Abdominal discomfort  Nausea  SOB (shortness of breath) -     Spirometry with Graph -     DG Chest 2 View; Future  Weight loss  Recent major surgery  Recurrent cold sores  Other orders -     pantoprazole (PROTONIX) 40 MG tablet; 1 tablet daily po 45 min prior to breakfast -     ondansetron (ZOFRAN ODT) 4 MG disintegrating tablet; Take 1 tablet (4 mg total) by mouth every 8 (eight) hours as needed for nausea or vomiting. -     valACYclovir (VALTREX) 500 MG tablet; Take 1 tablet (500 mg total) by mouth daily. Daily for suppression, use 4 tablets BID x 1 day for flare up -     albuterol (VENTOLIN HFA) 108 (90 Base) MCG/ACT inhaler; Inhale 1-2 puffs into the lungs every 6 (six) hours as needed for wheezing or shortness of  breath.    Follow up: pending xray

## 2021-11-10 NOTE — Telephone Encounter (Signed)
error 

## 2021-12-24 ENCOUNTER — Encounter (HOSPITAL_BASED_OUTPATIENT_CLINIC_OR_DEPARTMENT_OTHER): Payer: Self-pay | Admitting: Surgery

## 2021-12-24 NOTE — Progress Notes (Signed)
Spoke w/ via phone for pre-op interview--- Bruce Wiley---- NONE              Lab results------ COVID test -----patient states asymptomatic no test needed Arrive at -------1610 NPO after MN NO Solid Food.  Clear liquids from MN until---0915 Med rec completed Medications to take morning of surgery -----Bring Albuterol inhaler Diabetic medication ----- Patient instructed no nail polish to be worn day of surgery Patient instructed to bring photo id and insurance card day of surgery Patient aware to have Driver (ride ) / caregiver  Wife Bruce Wiley  for 24 hours after surgery  Patient Special Instructions ----- Pre-Op special Istructions ----- Patient verbalized understanding of instructions that were given at this phone interview. Patient denies shortness of breath, chest pain, fever, cough at this phone interview.

## 2021-12-28 ENCOUNTER — Ambulatory Visit: Payer: Self-pay | Admitting: Surgery

## 2022-01-03 ENCOUNTER — Ambulatory Visit (HOSPITAL_BASED_OUTPATIENT_CLINIC_OR_DEPARTMENT_OTHER): Payer: 59 | Admitting: Anesthesiology

## 2022-01-03 ENCOUNTER — Ambulatory Visit (HOSPITAL_BASED_OUTPATIENT_CLINIC_OR_DEPARTMENT_OTHER)
Admission: RE | Admit: 2022-01-03 | Discharge: 2022-01-03 | Disposition: A | Discharge status: Home / Self Care | Payer: 59 | Source: Ambulatory Visit | Attending: Surgery | Admitting: Surgery

## 2022-01-03 ENCOUNTER — Encounter (HOSPITAL_BASED_OUTPATIENT_CLINIC_OR_DEPARTMENT_OTHER): Payer: Self-pay | Admitting: Surgery

## 2022-01-03 ENCOUNTER — Encounter (HOSPITAL_BASED_OUTPATIENT_CLINIC_OR_DEPARTMENT_OTHER)
Admission: RE | Disposition: A | Discharge status: Home / Self Care | Payer: Self-pay | Source: Ambulatory Visit | Attending: Surgery

## 2022-01-03 DIAGNOSIS — K603 Anal fistula: Secondary | ICD-10-CM | POA: Diagnosis present

## 2022-01-03 DIAGNOSIS — K219 Gastro-esophageal reflux disease without esophagitis: Secondary | ICD-10-CM | POA: Diagnosis not present

## 2022-01-03 DIAGNOSIS — Z87891 Personal history of nicotine dependence: Secondary | ICD-10-CM | POA: Diagnosis not present

## 2022-01-03 HISTORY — PX: LIGATION OF INTERNAL FISTULA TRACT: SHX6551

## 2022-01-03 SURGERY — LIGATION, INTERNAL FISTULA TRACT
Anesthesia: General | Site: Rectum

## 2022-01-03 MED ORDER — METRONIDAZOLE 500 MG/100ML IV SOLN
500.0000 mg | INTRAVENOUS | Status: AC
  Administered 2022-01-03: 500 mg via INTRAVENOUS

## 2022-01-03 MED ORDER — FENTANYL CITRATE (PF) 100 MCG/2ML IJ SOLN
INTRAMUSCULAR | Status: DC | PRN
  Administered 2022-01-03: 100 ug via INTRAVENOUS

## 2022-01-03 MED ORDER — SODIUM CHLORIDE 0.9 % IV SOLN
INTRAVENOUS | Status: AC | PRN
  Administered 2022-01-03: 15 mL

## 2022-01-03 MED ORDER — MIDAZOLAM HCL 5 MG/5ML IJ SOLN
INTRAMUSCULAR | Status: DC | PRN
  Administered 2022-01-03: 2 mg via INTRAVENOUS

## 2022-01-03 MED ORDER — CHLORHEXIDINE GLUCONATE CLOTH 2 % EX PADS: 6.0000 | MEDICATED_PAD | Freq: Once | CUTANEOUS | Status: DC

## 2022-01-03 MED ORDER — FLEET ENEMA 7-19 GM/118ML RE ENEM: 1.0000 | ENEMA | Freq: Once | RECTAL | Status: DC

## 2022-01-03 MED ORDER — MIDAZOLAM HCL 2 MG/2ML IJ SOLN
INTRAMUSCULAR | Status: AC
  Filled 2022-01-03: qty 2

## 2022-01-03 MED ORDER — ACETAMINOPHEN 500 MG PO TABS
1000.0000 mg | ORAL_TABLET | ORAL | Status: AC
  Administered 2022-01-03: 1000 mg via ORAL

## 2022-01-03 MED ORDER — DEXMEDETOMIDINE (PRECEDEX) IN NS 20 MCG/5ML (4 MCG/ML) IV SYRINGE
PREFILLED_SYRINGE | INTRAVENOUS | Status: DC | PRN
  Administered 2022-01-03: 8 ug via INTRAVENOUS

## 2022-01-03 MED ORDER — ACETAMINOPHEN 500 MG PO TABS
ORAL_TABLET | ORAL | Status: AC
  Filled 2022-01-03: qty 2

## 2022-01-03 MED ORDER — CEFTRIAXONE SODIUM 2 G IJ SOLR
INTRAMUSCULAR | Status: AC
  Filled 2022-01-03: qty 20

## 2022-01-03 MED ORDER — BUPIVACAINE LIPOSOME 1.3 % IJ SUSP: 20.0000 mL | Freq: Once | INTRAMUSCULAR | Status: DC

## 2022-01-03 MED ORDER — SODIUM CHLORIDE 0.9 % IV SOLN
INTRAVENOUS | Status: AC
  Filled 2022-01-03: qty 100

## 2022-01-03 MED ORDER — DEXAMETHASONE SODIUM PHOSPHATE 10 MG/ML IJ SOLN
INTRAMUSCULAR | Status: DC | PRN
  Administered 2022-01-03: 10 mg via INTRAVENOUS

## 2022-01-03 MED ORDER — BUPIVACAINE LIPOSOME 1.3 % IJ SUSP
INTRAMUSCULAR | Status: DC | PRN
  Administered 2022-01-03: 20 mL

## 2022-01-03 MED ORDER — LACTATED RINGERS IV SOLN
INTRAVENOUS | Status: DC
  Administered 2022-01-03: 1000 mL via INTRAVENOUS

## 2022-01-03 MED ORDER — DEXMEDETOMIDINE HCL IN NACL 80 MCG/20ML IV SOLN
INTRAVENOUS | Status: AC
  Filled 2022-01-03: qty 20

## 2022-01-03 MED ORDER — BUPIVACAINE-EPINEPHRINE 0.5% -1:200000 IJ SOLN
INTRAMUSCULAR | Status: DC | PRN
  Administered 2022-01-03: 15 mL

## 2022-01-03 MED ORDER — TRAMADOL HCL 50 MG PO TABS: 50.0000 mg | ORAL_TABLET | Freq: Four times a day (QID) | ORAL | 0 refills | Status: AC | PRN

## 2022-01-03 MED ORDER — ONDANSETRON HCL 4 MG/2ML IJ SOLN
INTRAMUSCULAR | Status: AC
  Filled 2022-01-03: qty 2

## 2022-01-03 MED ORDER — LIDOCAINE 2% (20 MG/ML) 5 ML SYRINGE
INTRAMUSCULAR | Status: DC | PRN
  Administered 2022-01-03: 80 mg via INTRAVENOUS

## 2022-01-03 MED ORDER — SODIUM CHLORIDE 0.9 % IV SOLN
2.0000 g | INTRAVENOUS | Status: AC
  Administered 2022-01-03: 2 g via INTRAVENOUS

## 2022-01-03 MED ORDER — EPHEDRINE SULFATE-NACL 50-0.9 MG/10ML-% IV SOSY
PREFILLED_SYRINGE | INTRAVENOUS | Status: DC | PRN
  Administered 2022-01-03: 10 mg via INTRAVENOUS

## 2022-01-03 MED ORDER — ROCURONIUM BROMIDE 10 MG/ML (PF) SYRINGE
PREFILLED_SYRINGE | INTRAVENOUS | Status: AC
  Filled 2022-01-03: qty 10

## 2022-01-03 MED ORDER — DEXAMETHASONE SODIUM PHOSPHATE 10 MG/ML IJ SOLN
INTRAMUSCULAR | Status: AC
  Filled 2022-01-03: qty 1

## 2022-01-03 MED ORDER — 0.9 % SODIUM CHLORIDE (POUR BTL) OPTIME
TOPICAL | Status: DC | PRN
  Administered 2022-01-03: 500 mL

## 2022-01-03 MED ORDER — EPHEDRINE 5 MG/ML INJ
INTRAVENOUS | Status: AC
  Filled 2022-01-03: qty 5

## 2022-01-03 MED ORDER — PROPOFOL 10 MG/ML IV BOLUS
INTRAVENOUS | Status: AC
  Filled 2022-01-03: qty 20

## 2022-01-03 MED ORDER — METRONIDAZOLE 500 MG/100ML IV SOLN
INTRAVENOUS | Status: AC
  Filled 2022-01-03: qty 100

## 2022-01-03 MED ORDER — PROPOFOL 10 MG/ML IV BOLUS
INTRAVENOUS | Status: DC | PRN
  Administered 2022-01-03: 150 mg via INTRAVENOUS

## 2022-01-03 MED ORDER — LIDOCAINE HCL (PF) 2 % IJ SOLN
INTRAMUSCULAR | Status: AC
  Filled 2022-01-03: qty 5

## 2022-01-03 MED ORDER — FENTANYL CITRATE (PF) 100 MCG/2ML IJ SOLN
INTRAMUSCULAR | Status: AC
  Filled 2022-01-03: qty 2

## 2022-01-03 MED ORDER — SODIUM CHLORIDE 0.9 % IV SOLN: INTRAVENOUS | Status: DC

## 2022-01-03 MED ORDER — ONDANSETRON HCL 4 MG/2ML IJ SOLN
INTRAMUSCULAR | Status: DC | PRN
  Administered 2022-01-03: 4 mg via INTRAVENOUS

## 2022-01-03 MED ORDER — ROCURONIUM BROMIDE 10 MG/ML (PF) SYRINGE
PREFILLED_SYRINGE | INTRAVENOUS | Status: DC | PRN
  Administered 2022-01-03: 40 mg via INTRAVENOUS
  Administered 2022-01-03: 20 mg via INTRAVENOUS

## 2022-01-03 MED ORDER — SUGAMMADEX SODIUM 200 MG/2ML IV SOLN
INTRAVENOUS | Status: DC | PRN
  Administered 2022-01-03 (×2): 100 mg via INTRAVENOUS

## 2022-01-03 SURGICAL SUPPLY — 66 items
APL SKNCLS STERI-STRIP NONHPOA (GAUZE/BANDAGES/DRESSINGS) ×2
BENZOIN TINCTURE PRP APPL 2/3 (GAUZE/BANDAGES/DRESSINGS) ×4 IMPLANT
BLADE EXTENDED COATED 6.5IN (ELECTRODE) IMPLANT
BLADE SURG 10 STRL SS (BLADE) IMPLANT
BLADE SURG 15 STRL LF DISP TIS (BLADE) ×1 IMPLANT
BLADE SURG 15 STRL SS (BLADE) ×2
COVER BACK TABLE 60X90IN (DRAPES) ×2 IMPLANT
COVER MAYO STAND STRL (DRAPES) ×2 IMPLANT
DECANTER SPIKE VIAL GLASS SM (MISCELLANEOUS) ×2 IMPLANT
DRAPE LAPAROTOMY 100X72 PEDS (DRAPES) ×2 IMPLANT
DRAPE UTILITY XL STRL (DRAPES) ×2 IMPLANT
DRSG PAD ABDOMINAL 8X10 ST (GAUZE/BANDAGES/DRESSINGS) ×2 IMPLANT
ELECT NDL BLADE 2-5/6 (NEEDLE) ×1 IMPLANT
ELECT NEEDLE BLADE 2-5/6 (NEEDLE) ×4 IMPLANT
GAUZE 4X4 16PLY ~~LOC~~+RFID DBL (SPONGE) ×2 IMPLANT
GAUZE SPONGE 4X4 12PLY STRL (GAUZE/BANDAGES/DRESSINGS) ×2 IMPLANT
GAUZE SPONGES 4X4 ×1 IMPLANT
GLOVE BIO SURGEON STRL SZ7.5 (GLOVE) ×2 IMPLANT
GLOVE BIOGEL PI IND STRL 8 (GLOVE) ×1 IMPLANT
GLOVE BIOGEL PI INDICATOR 8 (GLOVE) ×1
GOWN STRL REUS W/TWL LRG LVL3 (GOWN DISPOSABLE) ×2 IMPLANT
HYDROGEN PEROXIDE 16OZ (MISCELLANEOUS) IMPLANT
IV CATH 14GX2 1/4 (CATHETERS) IMPLANT
IV CATH 18G SAFETY (IV SOLUTION) IMPLANT
KIT SIGMOIDOSCOPE (SET/KITS/TRAYS/PACK) IMPLANT
KIT TURNOVER CYSTO (KITS) ×2 IMPLANT
LOOP VESSEL MAXI BLUE (MISCELLANEOUS) IMPLANT
NDL SAFETY ECLIPSE 18X1.5 (NEEDLE) IMPLANT
NEEDLE HYPO 18GX1.5 SHARP (NEEDLE)
NEEDLE HYPO 22GX1.5 SAFETY (NEEDLE) ×2 IMPLANT
NS IRRIG 500ML POUR BTL (IV SOLUTION) ×2 IMPLANT
PACK BASIN DAY SURGERY FS (CUSTOM PROCEDURE TRAY) ×2 IMPLANT
PANTS MESH DISP LRG (UNDERPADS AND DIAPERS) ×2 IMPLANT
PENCIL SMOKE EVACUATOR (MISCELLANEOUS) ×2 IMPLANT
RETRACTOR RING URO 16.6X16.6 (MISCELLANEOUS) ×3 IMPLANT
RETRACTOR STAY HOOK 5MM (MISCELLANEOUS) ×3 IMPLANT
SPONGE HEMORRHOID 8X3CM (HEMOSTASIS) IMPLANT
SPONGE SURGIFOAM ABS GEL 12-7 (HEMOSTASIS) IMPLANT
SUCTION FRAZIER HANDLE 10FR (MISCELLANEOUS) ×2
SUCTION TUBE FRAZIER 10FR DISP (MISCELLANEOUS) ×1 IMPLANT
SUT CHROMIC 2 0 SH (SUTURE) IMPLANT
SUT CHROMIC 3 0 SH 27 (SUTURE) IMPLANT
SUT MNCRL AB 4-0 PS2 18 (SUTURE) ×1 IMPLANT
SUT SILK 0 TIES 10X30 (SUTURE) IMPLANT
SUT SILK 2 0 (SUTURE) ×2
SUT SILK 2-0 18XBRD TIE 12 (SUTURE) IMPLANT
SUT VIC AB 2-0 SH 27 (SUTURE)
SUT VIC AB 2-0 SH 27XBRD (SUTURE) IMPLANT
SUT VIC AB 2-0 UR6 27 (SUTURE) ×2 IMPLANT
SUT VIC AB 3-0 SH 18 (SUTURE) ×1 IMPLANT
SUT VIC AB 3-0 SH 27 (SUTURE)
SUT VIC AB 3-0 SH 27X BRD (SUTURE) IMPLANT
SUT VIC AB 3-0 SH 27XBRD (SUTURE) IMPLANT
SUT VIC AB 4-0 P-3 18XBRD (SUTURE) IMPLANT
SUT VIC AB 4-0 P3 18 (SUTURE)
SUT VICRYL 0 UR6 27IN ABS (SUTURE) IMPLANT
SUT VICRYL 2 0 18  UND BR (SUTURE)
SUT VICRYL 2 0 18 UND BR (SUTURE) IMPLANT
SYR 20ML LL LF (SYRINGE) IMPLANT
SYR BULB IRRIG 60ML STRL (SYRINGE) ×2 IMPLANT
SYR CONTROL 10ML LL (SYRINGE) ×2 IMPLANT
TOWEL OR 17X26 10 PK STRL BLUE (TOWEL DISPOSABLE) ×2 IMPLANT
TRAY DSU PREP LF (CUSTOM PROCEDURE TRAY) ×2 IMPLANT
TUBE CONNECTING 12X1/4 (SUCTIONS) ×2 IMPLANT
YANKAUER SUCT BULB TIP NO VENT (SUCTIONS) IMPLANT
mesh briefs ×1 IMPLANT

## 2022-01-03 NOTE — Anesthesia Procedure Notes (Signed)
Procedure Name: Intubation Date/Time: 01/03/2022 9:27 AM  Performed by: Rogers Blocker, CRNAPre-anesthesia Checklist: Patient identified, Emergency Drugs available, Suction available and Patient being monitored Patient Re-evaluated:Patient Re-evaluated prior to induction Oxygen Delivery Method: Circle System Utilized Preoxygenation: Pre-oxygenation with 100% oxygen Induction Type: IV induction Ventilation: Mask ventilation without difficulty Laryngoscope Size: Mac and 4 Grade View: Grade I Tube type: Oral Tube size: 7.5 mm Number of attempts: 1 Airway Equipment and Method: Stylet and Bite block Placement Confirmation: ETT inserted through vocal cords under direct vision, positive ETCO2 and breath sounds checked- equal and bilateral Secured at: 22 cm Tube secured with: Tape Dental Injury: Teeth and Oropharynx as per pre-operative assessment

## 2022-01-03 NOTE — Anesthesia Postprocedure Evaluation (Signed)
Anesthesia Post Note  Patient: Dragan Tamburrino  Procedure(s) Performed: LIGATION OF INTERSPHINTERIC FISTULA TRACT (Rectum) ANORECTAL EXAM UNDER ANESTHESIA (Rectum)     Patient location during evaluation: PACU Anesthesia Type: General Level of consciousness: awake and alert Pain management: pain level controlled Vital Signs Assessment: post-procedure vital signs reviewed and stable Respiratory status: spontaneous breathing, nonlabored ventilation, respiratory function stable and patient connected to nasal cannula oxygen Cardiovascular status: blood pressure returned to baseline and stable Postop Assessment: no apparent nausea or vomiting Anesthetic complications: no   No notable events documented.  Last Vitals:  Vitals:   01/03/22 1118 01/03/22 1121  BP:    Pulse: (!) 48 (!) 51  Resp: 16 13  Temp:  36.6 C  SpO2: 97% 96%    Last Pain:  Vitals:   01/03/22 1121  TempSrc: Oral  PainSc:                  Mahamud Metts

## 2022-01-03 NOTE — Anesthesia Preprocedure Evaluation (Signed)
Anesthesia Evaluation  Patient identified by MRN, date of birth, ID band Patient awake    Reviewed: Allergy & Precautions, NPO status , Patient's Chart, lab work & pertinent test results  Airway Mallampati: II  TM Distance: >3 FB Neck ROM: Full    Dental no notable dental hx. (+) Teeth Intact, Dental Advisory Given   Pulmonary neg pulmonary ROS, former smoker,    Pulmonary exam normal        Cardiovascular negative cardio ROS   Rhythm:Regular Rate:Normal     Neuro/Psych negative neurological ROS  negative psych ROS   GI/Hepatic Neg liver ROS, GERD  ,Anal fistula   Endo/Other  negative endocrine ROS  Renal/GU negative Renal ROS  negative genitourinary   Musculoskeletal Back lipoma   Abdominal Normal abdominal exam  (+)   Peds  Hematology negative hematology ROS (+)   Anesthesia Other Findings   Reproductive/Obstetrics                             Anesthesia Physical  Anesthesia Plan  ASA: 2  Anesthesia Plan: General   Post-op Pain Management:    Induction: Intravenous  PONV Risk Score and Plan: 2 and Ondansetron, Dexamethasone, Midazolam and Treatment may vary due to age or medical condition  Airway Management Planned: Oral ETT  Additional Equipment: None  Intra-op Plan:   Post-operative Plan: Extubation in OR  Informed Consent: I have reviewed the patients History and Physical, chart, labs and discussed the procedure including the risks, benefits and alternatives for the proposed anesthesia with the patient or authorized representative who has indicated his/her understanding and acceptance.     Dental advisory given  Plan Discussed with: CRNA and Anesthesiologist  Anesthesia Plan Comments:         Anesthesia Quick Evaluation

## 2022-01-03 NOTE — H&P (Signed)
CC: Here today for surgery  HPI: Bruce Wiley is an 46 y.o. male whom was seen in the office 10/2021 in follow-up for hx of transsphincteric anal fistula.  Colonoscopy with Dr. Hilarie Fredrickson 02/10/20 - 8 mm polyp in cecum, normal TI, diverticulosis   Cscope 2016 Dr. Paulita Fujita - few small polyps  No known history of IBD/Crohn's/UC. He has had this in the past with IBS but no colonoscopy has never demonstrated any significant findings. Reports that for the last approximately 1 year he has had drainage from a perianal wound that developed after having an incision/drainage of a gluteal abscess at an urgent care. It generally will heal off throughout 2 weeks and then reopened and drained. Denies any prior anorectal procedures or surgeries aside from what we just mentioned.  OR 09/29/21 -control of transsphincteric fistula with placement of draining seton, anorectal exam under anesthesia. Findings: Left anterior transfer anterior anal fistula with internal opening in the left anterior anal canal. This is at the level of the dentate. External opening was initially closed and there is about 1 cc of purulent fluid that was drained. This was controlled with a blue vessel loop. Based on the findings, we suspected he would be a good candidate down the road for a LIFT procedure.  INTERVAL HX He has been doing well. No complaints at present, seton remains. States he is ready for surgery.   PMH: Denies  PSH: Denies any prior anorectal procedures or surgeries  FHx: Denies any known family history of colorectal, breast, endometrial or ovarian cancer  Social Hx: Denies use of tobacco/EtOH/illicit drug. Works as a Programme researcher, broadcasting/film/video    Past Medical History:  Diagnosis Date   Allergy    seasonal   Genital herpes    GERD (gastroesophageal reflux disease)    Hemorrhoids    Pneumonia    Recurrent cold sores     Past Surgical History:  Procedure Laterality Date   COLONOSCOPY  2013   Dr. Paulita Fujita    INCISION AND DRAINAGE ABSCESS N/A 09/29/2021   Procedure: INCISION AND DRAINAGE OF PERIANAL WOUND;  Surgeon: Ileana Roup, MD;  Location: [facility];  Service: General;  Laterality: N/A;   LACERATION REPAIR Left    left volar wrist   LIPOMA EXCISION Left 09/29/2021   Procedure: EXCISION LIPOMA LEFT BACK;  Surgeon: Jovita Kussmaul, MD;  Location: [facility];  Service: General;  Laterality: Left;   PLACEMENT OF SETON N/A 09/29/2021   Procedure: PLACEMENT OF SETON;  Surgeon: Ileana Roup, MD;  Location: [facility];  Service: General;  Laterality: N/A;    Family History  Problem Relation Age of Onset   Breast cancer Mother    Colon cancer Mother 20   Heart disease Father 14       MI   Diabetes Father    Cancer Father 63       prostate, brain   Hypertension Father    Stroke Brother 57   Multiple sclerosis Brother    Breast cancer Paternal Aunt    Multiple sclerosis Cousin    Multiple sclerosis Cousin    Rectal cancer Neg Hx    Stomach cancer Neg Hx     Social:  reports that he quit smoking about 2 years ago. His smoking use included cigars and cigarettes. He has never used smokeless tobacco. He reports that he does not currently use alcohol. He reports current drug use. Drug: Marijuana.  Allergies:  Allergies  Allergen Reactions  Pineapple Flavor Itching    Medications: I have reviewed the patient's current medications.  No results found for this or any previous visit (from the past 48 hour(s)).  No results found.  ROS - all of the below systems have been reviewed with the patient and positives are indicated with bold text General: chills, fever or night sweats Eyes: blurry vision or double vision ENT: epistaxis or sore throat Allergy/Immunology: itchy/watery eyes or nasal congestion Hematologic/Lymphatic: bleeding problems, blood clots or swollen lymph nodes Endocrine: temperature intolerance or unexpected weight changes Breast: new or changing breast lumps or nipple  discharge Resp: cough, shortness of breath, or wheezing CV: chest pain or dyspnea on exertion GI: as per HPI GU: dysuria, trouble voiding, or hematuria MSK: joint pain or joint stiffness Neuro: TIA or stroke symptoms Derm: pruritus and skin lesion changes Psych: anxiety and depression  PE Height 6' (1.829 m), weight 94.3 kg. Constitutional: NAD; conversant Eyes: Moist conjunctiva; anicteric Lungs: Normal respiratory effort CV: RRR; no palpable thrills; no pitting edema MSK: Normal range of motion of extremities Psychiatric: Appropriate affect; alert and oriented x3  No results found for this or any previous visit (from the past 48 hour(s)).  No results found.  A/P: Bruce Wiley is an 46 y.o. male with left anterior transsphincteric anal fistula  -The anatomy and physiology of the anal canal was discussed with the patient with associated pictures. The pathophysiology of anal abscess/fistula was discussed at length with associated pictures and illustrations. -We have reviewed options going forward including further observation vs surgery -we spent time reviewing his findings at his last surgery including fistula type-Tran sphincteric. We discussed what a draining seton is and the goal to allow the tract to consolidate around the seton. We discussed generally waiting 3 months following that surgery before undergoing any further procedures to attempt to address the fistula. We discussed ligation of the intersphincteric fistulous tract (LIFT), anorectal exam under anesthesia. We discussed scenarios where a subsequent fistulotomy may be amenable and scenarios where the setons have migrated more distally. Although less than ideal we also discussed scenarios or people can have indwelling setons for longer periods of time and/or even indefinitely. -The planned procedure, material risks (including, but not limited to, pain, bleeding, infection, scarring, need for blood transfusion, damage to  anal sphincter, incontinence of gas and/or stool, need for additional procedures, recurrence rates for fistulas following this surgery of ~20-25%, pneumonia, heart attack, stroke, death) benefits and alternatives to surgery were discussed at length. The patient's questions were answered to his satisfaction, he voiced understanding and elected to proceed with surgery. Additionally, we discussed typical postoperative expectations and the recovery process.  Nadeen Landau, [facility], [place]

## 2022-01-03 NOTE — Transfer of Care (Signed)
Immediate Anesthesia Transfer of Care Note  Patient: Bruce Wiley  Procedure(s) Performed: LIGATION OF INTERSPHINTERIC FISTULA TRACT (Rectum) ANORECTAL EXAM UNDER ANESTHESIA (Rectum)  Patient Location: PACU  Anesthesia Type:General  Level of Consciousness: drowsy, patient cooperative and responds to stimulation  Airway & Oxygen Therapy: Patient Spontanous Breathing  Post-op Assessment: Report given to RN and Post -op Vital signs reviewed and stable  Post vital signs: Reviewed and stable  Last Vitals:  Vitals Value Taken Time  BP 117/64 01/03/22 1041  Temp    Pulse 57 01/03/22 1043  Resp 11 01/03/22 1043  SpO2 93 % 01/03/22 1043  Vitals shown include unvalidated device data.  Last Pain:  Vitals:   01/03/22 0857  TempSrc: Oral  PainSc: 0-No pain      Patients Stated Pain Goal: 3 (15/04/13 6438)  Complications: No notable events documented.

## 2022-01-03 NOTE — Op Note (Signed)
01/03/2022  10:36 AM  PATIENT:  Bruce Wiley  46 y.o. male  Patient Care Team: Tysinger, Camelia Eng, PA-C as PCP - General (Family Medicine)  PRE-OPERATIVE DIAGNOSIS:  Transsphincteric anal fistula  POST-OPERATIVE DIAGNOSIS:  Same  PROCEDURE:   Ligation of intersphincteric fistulous tract (LIFT) Anorectal exam under anesthesia  SURGEON:  Surgeon(s): Ileana Roup, MD  ASSISTANT: OR staff   ANESTHESIA:   local and general  SPECIMEN:  No Specimen  DISPOSITION OF SPECIMEN:  N/A  COUNTS:  Sponge, needle, and instrument counts were reported correct x2 at conclusion.  EBL: 15 mL  Drains: None  PLAN OF CARE: Discharge to home after PACU  PATIENT DISPOSITION:  PACU - hemodynamically stable.  OR FINDINGS: Left anterior transsphincteric anal fistula.  Indwelling blue vessel loop seton.  No evident abscess or purulent drainage.  This was amenable to a LIFT procedure.  External opening skin was excised and granulation tissue gently removed with curette to facilitate healing.  DESCRIPTION: The patient was identified in the preoperative holding area and taken to the OR. SCDs were applied. He then underwent general endotracheal anesthesia without difficulty. The patient was then rolled onto the OR table in the prone jackknife position. Pressure points were then evaluated and padded. Benzoin was applied to the buttocks and they were gently taped apart.  He was then prepped and draped in usual sterile fashion.  A surgical timeout was performed indicating the correct patient, procedure, and positioning.  A perianal block was then created using a dilute mixture of 0.25% Marcaine with epinephrine and Exparel.  After ascertaining an appropriate level of anesthesia had been achieved, a well lubricated digital rectal exam was performed. This demonstrated no palpable masses or other abnormalities.  A Hill-Ferguson anoscope was into the anal canal and circumferential inspection demonstrated  indwelling seton in the left anterior position.  Normal-appearing anoderm without fissures or other granulation tissue.  Perianal skin is normal.  There are no abscesses or other abnormal findings.  Indwelling seton is noted to be in place.  No purulent drainage.    The seton is exchanged for a semirigid fistula probe.  The tract is then palpated and found to remain a transsphincteric fistula in the left anterior position.  Therefore, preparations were made to proceed with a ligation of the intersphincteric fistulous tract.  The intersphincteric plane is readily identified.  The skin is then incised sharply.  Dissection is maintained within the intersphincteric plane with the fistula probe in place down to the level of the fistula, taking care not to divide any sphincter fibers.  The fistula circumferentially dissected.  After obtaining control of the fistula circumferentially, the semirigid probe was removed.  The fistula was then clamped.  This is then divided sharply between clamps.  The internal and external sides were both doubly suture-ligated using 2-0 Vicryl figure-of-eight sutures.  The internal opening is gently probed with a crypt hook and found to be completely excluded.  The external opening is then carefully cannulated with the semirigid probe and found to also be completely excluded.  Small amount of sphincter is closed over both to provide some space between the closures and the intersphincteric space.  The anal canal is irrigated in the intersphincteric plane and hemostasis verified.  The wound is then closed in layers using 3-0 Vicryl suture followed by a running 4-0 Monocryl subcuticular suture.  Additional local anesthetic is infiltrated.  All sponge, needle, instrument counts were reported correct.  The perianal area was then washed and  dried.  The buttocks are untaped.  A dressing consisting of 4 x 4's, ABD, mesh underwear was placed.  He is then rolled back onto a stretcher, awakened  from anesthesia, extubated, and transferred to the recovery room in satisfactory condition.  DISPOSITION: PACU in satisfactory condition.

## 2022-01-03 NOTE — Discharge Instructions (Addendum)
ANORECTAL SURGERY: POST OP INSTRUCTIONS  DIET: Follow a light bland diet the first 24 hours after arrival home, such as soup, liquids, crackers, etc.  Be sure to include lots of fluids daily.  Avoid fast food or heavy meals as your are more likely to get nauseated.  Eat a low fat diet the next few days after surgery.   Some bleeding with bowel movements is expected for the first couple of days but this should stop in between bowel movements  Take your usually prescribed home medications unless otherwise directed. No foreign bodies per rectum for the next 3 months (enemas, etc)  PAIN CONTROL: It is helpful to take an over-the-counter pain medication regularly for the first few days/weeks.  Choose from the following that works best for you: Ibuprofen (Advil, etc) Three 200mg tabs every 6 hours as needed. Acetaminophen (Tylenol, etc) 500-650mg every 6 hours as needed NOTE: You may take both of these medications together - most patients find it most helpful when alternating between the two (i.e. Ibuprofen at 6am, tylenol at 9am, ibuprofen at 12pm ...) A  prescription for pain medication may have been prescribed for you at discharge.  Take your pain medication as prescribed.  If you are having problems/concerns with the prescription medicine, please call us for further advice.  Avoid getting constipated.  Between the surgery and the pain medications, it is common to experience some constipation.  Increasing fluid intake (64oz of water per day) and taking a fiber supplement (such as Metamucil, Citrucel, FiberCon) 1-2 times a day regularly will usually help prevent this problem from occurring.  Take Miralax (over the counter) 1-2x/day while taking a narcotic pain medication. If no bowel movement after 48hours, you may additionally take a laxative like a bottle of Milk of Magnesia which can be purchased over the counter. Avoid enemas.   Watch out for diarrhea.  If you have many loose bowel movements,  simplify your diet to bland foods.  Stop any stool softeners and decrease your fiber supplement. If this worsens or does not improve, please call us.  Wash / shower every day.  If you were discharged with a dressing, you may remove this the day after your surgery. You may shower normally, getting soap/water on your wound, particularly after bowel movements.  Soaking in a warm bath filled a couple inches ("Sitz bath") is a great way to clean the area after a bowel movement and many patients find it is a way to soothe the area.  ACTIVITIES as tolerated:   You may resume regular (light) daily activities beginning the next day--such as daily self-care, walking, climbing stairs--gradually increasing activities as tolerated.  If you can walk 30 minutes without difficulty, it is safe to try more intense activity such as jogging, treadmill, bicycling, low-impact aerobics, etc. Refrain from any heavy lifting or straining for the first 2 weeks after your procedure, particularly if your surgery was for hemorrhoids. Avoid activities that make your pain worse You may drive when you are no longer taking prescription pain medication, you can comfortably wear a seatbelt, and you can safely maneuver your car and apply brakes.  FOLLOW UP in our office Please call [facility] at [phone] to set up an appointment to see your surgeon in the office for a follow-up appointment approximately 2 weeks after your surgery. Make sure that you call for this appointment the day you arrive home to insure a convenient appointment time.  9. If you have disability or family leave forms   that need to be completed, you may have them completed by your primary care physician's office; for return to work instructions, please ask our office staff and they will be happy to assist you in obtaining this documentation   When to call us [phone]: Poor pain control Reactions / problems with new medications (rash/itching, etc)  Fever over  101.5 F (38.5 C) Inability to urinate Nausea/vomiting Worsening swelling or bruising Continued bleeding from incision. Increased pain, redness, or drainage from the incision  The clinic staff is available to answer your questions during regular business hours (8:30am-5pm).  Please don't hesitate to call and ask to speak to one of our nurses for clinical concerns.   A surgeon from [facility] is always on call at the hospitals   If you have a medical emergency, go to the nearest emergency room or call 911.   [facility] [address], [address], [place], [place]  [address] MAIN: [phone] FAX: [phone] [email or web address]  Post Anesthesia Home Care Instructions  Activity: Get plenty of rest for the remainder of the day. A responsible adult should stay with you for 24 hours following the procedure.  For the next 24 hours, DO NOT: -Drive a car -Operate machinery -Drink alcoholic beverages -Take any medication unless instructed by your physician -Make any legal decisions or sign important papers.  Meals: Start with liquid foods such as gelatin or soup. Progress to regular foods as tolerated. Avoid greasy, spicy, heavy foods. If nausea and/or vomiting occur, drink only clear liquids until the nausea and/or vomiting subsides. Call your physician if vomiting continues.  Special Instructions/Symptoms: Your throat may feel dry or sore from the anesthesia or the breathing tube placed in your throat during surgery. If this causes discomfort, gargle with warm salt water. The discomfort should disappear within 24 hours.    Information for Discharge Teaching: EXPAREL (bupivacaine liposome injectable suspension)   Your surgeon gave you EXPAREL(bupivacaine) in your surgical incision to help control your pain after surgery.  EXPAREL is a local anesthetic that provides pain relief by numbing the tissue around the  surgical site. EXPAREL is designed to release pain medication over time and can control pain for up to 72 hours. Depending on how you respond to EXPAREL, you may require less pain medication during your recovery.  Possible side effects: Temporary loss of sensation or ability to move in the area where bupivacaine was injected. Nausea, vomiting, constipation Rarely, numbness and tingling in your mouth or lips, lightheadedness, or anxiety may occur. Call your doctor right away if you think you may be experiencing any of these sensations, or if you have other questions regarding possible side effects.  Follow all other discharge instructions given to you by your surgeon or nurse. Eat a healthy diet and drink plenty of water or other fluids.  If you return to the hospital for any reason within 96 hours following the administration of EXPAREL, please inform your health care providers. 

## 2022-01-04 ENCOUNTER — Encounter (HOSPITAL_BASED_OUTPATIENT_CLINIC_OR_DEPARTMENT_OTHER): Payer: Self-pay | Admitting: Surgery

## 2022-01-26 ENCOUNTER — Encounter: Payer: Self-pay | Admitting: Internal Medicine

## 2022-03-01 ENCOUNTER — Encounter: Payer: Self-pay | Admitting: Internal Medicine

## 2022-05-23 DIAGNOSIS — L988 Other specified disorders of the skin and subcutaneous tissue: Secondary | ICD-10-CM

## 2022-05-23 HISTORY — DX: Other specified disorders of the skin and subcutaneous tissue: L98.8

## 2022-06-29 ENCOUNTER — Other Ambulatory Visit: Payer: Self-pay | Admitting: Medical

## 2022-06-29 NOTE — Telephone Encounter (Signed)
Left message for pt to call back to schedule a visit. He is over due for a visit

## 2022-07-23 ENCOUNTER — Encounter (HOSPITAL_COMMUNITY): Payer: Self-pay

## 2022-07-23 ENCOUNTER — Ambulatory Visit (HOSPITAL_COMMUNITY)
Admission: EM | Admit: 2022-07-23 | Discharge: 2022-07-23 | Disposition: A | Discharge status: Home / Self Care | Payer: 59 | Attending: Emergency Medicine | Admitting: Emergency Medicine

## 2022-07-23 DIAGNOSIS — J309 Allergic rhinitis, unspecified: Secondary | ICD-10-CM | POA: Diagnosis not present

## 2022-07-23 MED ORDER — FLUTICASONE PROPIONATE 50 MCG/ACT NA SUSP: 2.0000 | Freq: Every day | NASAL | 2 refills | Status: AC

## 2022-07-23 MED ORDER — GUAIFENESIN ER 600 MG PO TB12: 1200.0000 mg | ORAL_TABLET | Freq: Two times a day (BID) | ORAL | 0 refills | Status: AC

## 2022-07-23 MED ORDER — CETIRIZINE HCL 10 MG PO TABS: 10.0000 mg | ORAL_TABLET | Freq: Every day | ORAL | 0 refills | Status: AC

## 2022-07-23 NOTE — ED Provider Notes (Signed)
[facility]    CSN: [id number] Arrival date & time: 07/23/22  1005      History   Chief Complaint Chief Complaint  Patient presents with   Nasal Congestion    HPI Bruce Wiley is a 47 y.o. male.   Reports he has nasal congestion ongoing for the past three weeks, had a prescription for a nasal spray, but it has since run out. Denies sinus pain or headache. Reports seasonal allergies, these usually are in Summer and Winter. Reports mild fatigue, but he has not been sleeping well. Reports weight loss and frequent bowel movements, feels as if this is due to his mental stress. Had prior thoughts of self-harm, was dealing with separation from his wife, thoughts have since resolved. Is trying to get in with a therapist, but this seems to be a long process for him. Denies CP or SOB.     The history is provided by the patient.    Past Medical History:  Diagnosis Date   Allergy    seasonal   Genital herpes    GERD (gastroesophageal reflux disease)    Hemorrhoids    Pneumonia    Recurrent cold sores     Patient Active Problem List   Diagnosis Date Noted   Lipoma of torso 09/14/2020   Benign prostatic hyperplasia with urinary hesitancy 09/14/2020   Screening for heart disease 09/14/2020   Skin lesion of back 07/14/2020   Former smoker 07/14/2020   Screen for STD (sexually transmitted disease) 09/03/2019   Vaccine counseling 09/03/2019   Ataxia 09/03/2019   Skin lesion 09/03/2019   Genital herpes simplex 07/31/2019   Family history of prostate cancer in father 07/31/2019   Family history of colon cancer in mother 07/31/2019   Lumbar back pain 05/13/2019   Recurrent cold sores 12/12/2017   Chronic abdominal pain 08/07/2017   Globus sensation 08/07/2017   Gastroesophageal reflux disease 08/07/2017   Hemorrhoids 08/07/2017   Erectile dysfunction 08/07/2017   Irritable bowel syndrome with both constipation and diarrhea 08/07/2017    Past Surgical History:   Procedure Laterality Date   COLONOSCOPY  2013   Dr. Paulita Fujita   INCISION AND DRAINAGE ABSCESS N/A 09/29/2021   Procedure: INCISION AND DRAINAGE OF PERIANAL WOUND;  Surgeon: Ileana Roup, MD;  Location: [facility];  Service: General;  Laterality: N/A;   LACERATION REPAIR Left    left volar wrist   LIGATION OF INTERNAL FISTULA TRACT N/A 01/03/2022   Procedure: LIGATION OF INTERSPHINTERIC FISTULA TRACT;  Surgeon: Ileana Roup, MD;  Location: [facility];  Service: General;  Laterality: N/A;   LIPOMA EXCISION Left 09/29/2021   Procedure: EXCISION LIPOMA LEFT BACK;  Surgeon: Jovita Kussmaul, MD;  Location: [facility];  Service: General;  Laterality: Left;   PLACEMENT OF SETON N/A 09/29/2021   Procedure: PLACEMENT OF SETON;  Surgeon: Ileana Roup, MD;  Location: [facility];  Service: General;  Laterality: N/A;       Home Medications    Prior to Admission medications   Medication Sig Start Date End Date Taking? Authorizing Provider  fluticasone (FLONASE) 50 MCG/ACT nasal spray Place 2 sprays into both nostrils daily. 07/23/22 08/22/22 Yes Louretta Shorten, [place], FNP  guaiFENesin (MUCINEX) 600 MG 12 hr tablet Take 2 tablets (1,200 mg total) by mouth 2 (two) times daily. 07/23/22 08/22/22 Yes Louretta Shorten, [place], FNP  albuterol (VENTOLIN HFA) 108 (90 Base) MCG/ACT inhaler Inhale 1-2 puffs into the lungs every 6 (six) hours as  needed for wheezing or shortness of breath. 10/20/21   Tysinger, Camelia Eng, PA-C  ondansetron (ZOFRAN ODT) 4 MG disintegrating tablet Take 1 tablet (4 mg total) by mouth every 8 (eight) hours as needed for nausea or vomiting. 10/20/21   Tysinger, Camelia Eng, PA-C  pantoprazole (PROTONIX) 40 MG tablet 1 tablet daily po 45 min prior to breakfast Patient not taking: Reported on 12/24/2021 10/20/21   Tysinger, Camelia Eng, PA-C  sildenafil (VIAGRA) 50 MG tablet TAKE ONE TABLET BY MOUTH DAILY AS NEEDED FOR ERECTILE DYSFUNCTION 09/29/21   Tysinger, Camelia Eng, PA-C  tetrahydrozoline-zinc  (VISINE-AC) 0.05-0.25 % ophthalmic solution Place 2 drops into both eyes 3 (three) times daily as needed (dry/irritayed eyes).    [provider]  valACYclovir (VALTREX) 500 MG tablet Take 1 tablet (500 mg total) by mouth daily. Daily for suppression, use 4 tablets BID x 1 day for flare up 10/20/21   Tysinger, Camelia Eng, PA-C    Family History Family History  Problem Relation Age of Onset   Breast cancer Mother    Colon cancer Mother 92   Heart disease Father 61       MI   Diabetes Father    Cancer Father 52       prostate, brain   Hypertension Father    Stroke Brother 31   Multiple sclerosis Brother    Breast cancer Paternal Aunt    Multiple sclerosis Cousin    Multiple sclerosis Cousin    Rectal cancer Neg Hx    Stomach cancer Neg Hx     Social History Social History   Tobacco Use   Smoking status: Former    Types: Cigars, Cigarettes    Quit date: 09/18/2019    Years since quitting: 2.8   Smokeless tobacco: Never  Vaping Use   Vaping Use: Former  Substance Use Topics   Alcohol use: Not Currently    Comment: Occasionally   Drug use: Yes    Types: Marijuana    Comment: Daily     Allergies   Pineapple flavor   Review of Systems Review of Systems  Constitutional:  Positive for unexpected weight change. Negative for chills and fever.  HENT:  Positive for postnasal drip, rhinorrhea and sore throat. Negative for ear pain.   Eyes:  Negative for pain and visual disturbance.  Respiratory:  Negative for cough and shortness of breath.   Cardiovascular:  Negative for chest pain and palpitations.  Gastrointestinal:  Negative for abdominal pain and vomiting.  Genitourinary:  Negative for difficulty urinating, dysuria, flank pain, genital sores, hematuria, penile pain, penile swelling, scrotal swelling and urgency.  Musculoskeletal:  Negative for arthralgias and back pain.  Skin:  Negative for color change and rash.  Neurological:  Negative for seizures and syncope.   All other systems reviewed and are negative.    Physical Exam Triage Vital Signs ED Triage Vitals [07/23/22 1025]  Enc Vitals Group     BP 124/80     Pulse Rate 66     Resp 16     Temp 98.2 F (36.8 C)     Temp Source Oral     SpO2 97 %     Weight      Height      Head Circumference      Peak Flow      Pain Score 0     Pain Loc      Pain Edu?      Excl. in [place]?  No data found.  Updated Vital Signs BP 124/80 (BP Location: Left Arm)   Pulse 66   Temp 98.2 F (36.8 C) (Oral)   Resp 16   SpO2 97%   Visual Acuity Right Eye Distance:   Left Eye Distance:   Bilateral Distance:    Right Eye Near:   Left Eye Near:    Bilateral Near:     Physical Exam Vitals and nursing note reviewed.  Constitutional:      General: He is not in acute distress.    Appearance: He is well-developed.  HENT:     Head: Normocephalic and atraumatic.     Right Ear: External ear normal.     Left Ear: External ear normal.     Nose: Congestion and rhinorrhea present.     Mouth/Throat:     Mouth: Mucous membranes are moist.     Pharynx: Posterior oropharyngeal erythema present. No oropharyngeal exudate.  Eyes:     General: No scleral icterus.       Right eye: No discharge.        Left eye: No discharge.     Conjunctiva/sclera: Conjunctivae normal.  Cardiovascular:     Rate and Rhythm: Normal rate and regular rhythm.     Pulses: Normal pulses.     Heart sounds: Normal heart sounds. No murmur heard. Pulmonary:     Effort: Pulmonary effort is normal. No respiratory distress.     Breath sounds: Normal breath sounds.  Musculoskeletal:        General: No swelling. Normal range of motion.     Cervical back: Normal range of motion and neck supple.  Skin:    General: Skin is warm and dry.     Capillary Refill: Capillary refill takes less than 2 seconds.  Neurological:     Mental Status: He is alert.  Psychiatric:        Mood and Affect: Mood normal.      UC Treatments / Results   Labs (all labs ordered are listed, but only abnormal results are displayed) Labs Reviewed - No data to display  EKG   Radiology No results found.  Procedures Procedures (including critical care time)  Medications Ordered in UC Medications - No data to display  Initial Impression / Assessment and Plan / UC Course  I have reviewed the triage vital signs and the nursing notes.  Pertinent labs & imaging results that were available during my care of the patient were reviewed by me and considered in my medical decision making (see chart for details).  Vitals and triage reviewed, patient is hemodynamically stable.  Feel as if the consistent nasal congestion is due to allergic rhinitis.  Without sinus tenderness, fevers, or signs or symptoms of periorbital cellulitis.  Will restart on Flonase and Mucinex.  Discussed mental health issues, no active SI or HI.  Given resources to follow-up with behavioral health urgent care as needed.  Encouraged to establish with a primary care for further management.  Patient verbalized understanding, no questions at this time.     Final Clinical Impressions(s) / UC Diagnoses   Final diagnoses:  Allergic rhinitis, unspecified seasonality, unspecified trigger     Discharge Instructions      Your symptoms are consistent with allergic rhinitis, I believe this is causing your sore throat.  Please take the Flonase nasal spray 2 sprays into both nostrils once daily.  Please also take Mucinex twice daily, ensure you are drinking at least 64 ounces of water  daily as well as.  Follow-up with behavioral health urgent care as needed for your mental health emergencies.  Please get established with [facility] for a primary care provider.  Please return if you have any worsening of symptoms, or seek immediate care.     ED Prescriptions     Medication Sig Dispense Auth. Provider   fluticasone (FLONASE) 50 MCG/ACT nasal spray Place  2 sprays into both nostrils daily. 11.1 mL Corri Delapaz, [place], FNP   guaiFENesin (MUCINEX) 600 MG 12 hr tablet Take 2 tablets (1,200 mg total) by mouth 2 (two) times daily. 120 tablet Gricelda Foland, [place] N, [place]      I have reviewed the PDMP during this encounter.   Tarrin Lebow, [place],  07/23/22 1052

## 2022-07-23 NOTE — ED Triage Notes (Signed)
Pt states congestion for 2-3 weeks.  Also states he would like to be tested for STD's.

## 2022-07-23 NOTE — Discharge Instructions (Addendum)
Your symptoms are consistent with allergic rhinitis, I believe this is causing your sore throat.  Please take the Flonase nasal spray 2 sprays into both nostrils once daily.  Please also take Mucinex twice daily, ensure you are drinking at least 64 ounces of water daily as well as.  Follow-up with behavioral health urgent care as needed for your mental health emergencies.  Please get established with  community health and wellness for a primary care provider.  Please return if you have any worsening of symptoms, or seek immediate care.

## 2022-09-19 ENCOUNTER — Other Ambulatory Visit: Payer: Self-pay | Admitting: Medical

## 2022-09-19 NOTE — Telephone Encounter (Signed)
Pt has an appt in May 

## 2022-10-21 ENCOUNTER — Ambulatory Visit (INDEPENDENT_AMBULATORY_CARE_PROVIDER_SITE_OTHER): Payer: 59 | Admitting: Medical

## 2022-10-21 ENCOUNTER — Encounter: Payer: Self-pay | Admitting: Medical

## 2022-10-21 VITALS — BP 114/72 | HR 50 | Temp 98.2°F | Ht 72.75 in | Wt 191.4 lb

## 2022-10-21 DIAGNOSIS — G8929 Other chronic pain: Secondary | ICD-10-CM

## 2022-10-21 DIAGNOSIS — Z13220 Encounter for screening for lipoid disorders: Secondary | ICD-10-CM | POA: Insufficient documentation

## 2022-10-21 DIAGNOSIS — R4589 Other symptoms and signs involving emotional state: Secondary | ICD-10-CM

## 2022-10-21 DIAGNOSIS — R109 Unspecified abdominal pain: Secondary | ICD-10-CM

## 2022-10-21 DIAGNOSIS — Z0000 Encounter for general adult medical examination without abnormal findings: Secondary | ICD-10-CM | POA: Diagnosis not present

## 2022-10-21 DIAGNOSIS — Z113 Encounter for screening for infections with a predominantly sexual mode of transmission: Secondary | ICD-10-CM

## 2022-10-21 DIAGNOSIS — Z7185 Encounter for immunization safety counseling: Secondary | ICD-10-CM

## 2022-10-21 DIAGNOSIS — N529 Male erectile dysfunction, unspecified: Secondary | ICD-10-CM

## 2022-10-21 DIAGNOSIS — R3911 Hesitancy of micturition: Secondary | ICD-10-CM

## 2022-10-21 DIAGNOSIS — R1310 Dysphagia, unspecified: Secondary | ICD-10-CM

## 2022-10-21 DIAGNOSIS — R634 Abnormal weight loss: Secondary | ICD-10-CM

## 2022-10-21 DIAGNOSIS — K219 Gastro-esophageal reflux disease without esophagitis: Secondary | ICD-10-CM

## 2022-10-21 DIAGNOSIS — N401 Enlarged prostate with lower urinary tract symptoms: Secondary | ICD-10-CM

## 2022-10-21 DIAGNOSIS — Z125 Encounter for screening for malignant neoplasm of prostate: Secondary | ICD-10-CM | POA: Insufficient documentation

## 2022-10-21 DIAGNOSIS — Z87891 Personal history of nicotine dependence: Secondary | ICD-10-CM | POA: Diagnosis not present

## 2022-10-21 LAB — POCT URINALYSIS DIP (PROADVANTAGE DEVICE)
Bilirubin, UA: NEGATIVE
Blood, UA: NEGATIVE
Glucose, UA: NEGATIVE mg/dL
Ketones, POC UA: NEGATIVE mg/dL
Leukocytes, UA: NEGATIVE
Nitrite, UA: NEGATIVE
Protein Ur, POC: NEGATIVE mg/dL
Specific Gravity, Urine: 1.025
Urobilinogen, Ur: 0.2
pH, UA: 6 (ref 5.0–8.0)

## 2022-10-21 MED ORDER — DOXYCYCLINE HYCLATE 100 MG PO TABS: 100.0000 mg | ORAL_TABLET | Freq: Two times a day (BID) | ORAL | 0 refills | Status: DC

## 2022-10-21 NOTE — Patient Instructions (Addendum)
Tamela Oddi, PA [address] [place], [place] [address] [phone]  [facility] [address] [place], [place] [address] [phone]     This visit was a preventative care visit, also known as wellness visit or routine physical.   Topics typically include healthy lifestyle, diet, exercise, preventative care, vaccinations, sick and well care, proper use of emergency dept and after hours care, as well as other concerns.     Separate significant issues discussed: Weight loss, decreased appetite - discussed possible causes.  He went though separation in 06/2022 and has had some depressed mood.  Updated labs today  ED - not the best response with Viagra.  Begin trial of cilias. Discussed risks/benefit and proper use of medication  Swallow concerns, chronic abdominal pain, hx/o anal fistula - pending labs, likely referral back to Dr. Gustavo Lah  Depressed mood - advised he try counseling, gave some resources  Post nasal drainage, congestion - begin 7 day trial of doxycyline antibiotic along with salt water gargles, and continue allergy pill daily for at least the next 10 days    General Recommendations: Continue to return yearly for your annual wellness and preventative care visits.  This gives [place] a chance to discuss healthy lifestyle, exercise, vaccinations, review your chart record, and perform screenings where appropriate.  I recommend you see your eye doctor yearly for routine vision care.  I recommend you see your dentist yearly for routine dental care including hygiene visits twice yearly.   Vaccination  Immunization History  Administered Date(s) Administered   Influenza,inj,Quad PF,6+ Mos 02/17/2017   Influenza-Unspecified 02/20/2021   PFIZER(Purple Top)SARS-COV-2 Vaccination 09/26/2019, 10/24/2019   Tdap 09/03/2019    Screening for cancer: Colon cancer screening: Prior or last colon cancer screen: 2021  Testicular cancer screening You should  do a monthly self testicular exam if you are between 15-26 years old, and we typically do a testicular exam on the yearly physical for this same age group.   Prostate Cancer screening: The recommended prostate cancer screening test is a blood test called the prostate-specific antigen (PSA) test. PSA is a protein that is made in the prostate. As you age, your prostate naturally produces more PSA. Abnormally high PSA levels may be caused by: Prostate cancer. An enlarged prostate that is not caused by cancer (benign prostatic hyperplasia, or BPH). This condition is very common in older men. A prostate gland infection (prostatitis) or urinary tract infection. Certain medicines such as male hormones (like testosterone) or other medicines that raise testosterone levels. A rectal exam may be done as part of prostate cancer screening to help provide information about the size of your prostate gland. When a rectal exam is performed, it should be done after the PSA level is drawn to avoid any effect on the results.   Skin cancer screening: Check your skin regularly for new changes, growing lesions, or other lesions of concern Come in for evaluation if you have skin lesions of concern.   Lung cancer screening: If you have a greater than 20 pack year history of tobacco use, then you may qualify for lung cancer screening with a chest CT scan.   Please call your insurance company to inquire about coverage for this test.   Pancreatic cancer:  no current screening test is available or routinely recommended. (risk factors: smoking, overweight or obese, diabetes, chronic pancreatitis, work exposure - dry cleaning, metal working, 45yo>, M>F, Tree surgeon, family hx/o, hereditary breast, ovarian, melanoma, lynch, peutz-jeghers).  Symptoms: jaundice,  dark urine, light color or greasy stools, itchy skin, belly or back pain, weight loss, poor appetite, nausea, vomiting, liver enlargement, DVT/blood clots.   We  currently don't have screenings for other cancers besides breast, cervical, colon, and lung cancers.  If you have a strong family history of cancer or have other cancer screening concerns, please let me know.  Genetic testing referral is an option for individuals with high cancer risk in the family.  There are some other cancer screenings in development currently.   Bone health: Get at least 150 minutes of aerobic exercise weekly Get weight bearing exercise at least once weekly Bone density test:  A bone density test is an imaging test that uses a type of X-ray to measure the amount of calcium and other minerals in your bones. The test may be used to diagnose or screen you for a condition that causes weak or thin bones (osteoporosis), predict your risk for a broken bone (fracture), or determine how well your osteoporosis treatment is working. The bone density test is recommended for females 65 and older, or females or males <65 if certain risk factors such as thyroid disease, long term use of steroids such as for asthma or rheumatological issues, vitamin D deficiency, estrogen deficiency, family history of osteoporosis, self or family history of fragility fracture in first degree relative.    Heart health: Get at least 150 minutes of aerobic exercise weekly Limit alcohol It is important to maintain a healthy blood pressure and healthy cholesterol numbers  Heart disease screening: Screening for heart disease includes screening for blood pressure, fasting lipids, glucose/diabetes screening, BMI height to weight ratio, reviewed of smoking status, physical activity, and diet.    Goals include blood pressure 120/80 or less, maintaining a healthy lipid/cholesterol profile, preventing diabetes or keeping diabetes numbers under good control, not smoking or using tobacco products, exercising most days per week or at least 150 minutes per week of exercise, and eating healthy variety of fruits and  vegetables, healthy oils, and avoiding unhealthy food choices like fried food, fast food, high sugar and high cholesterol foods.    Other tests may possibly include EKG test, CT coronary calcium score, echocardiogram, exercise treadmill stress test.      Vascular disease screening: For higher risk individuals including smokers, diabetics, patients with known heart disease or high blood pressure, kidney disease, and others, screening for vascular disease or atherosclerosis of the arteries is available.  Examples may include carotid ultrasound, abdominal aortic ultrasound, ABI blood flow screening in the legs, thoracic aorta screening.    Medical care options: I recommend you continue to seek care here first for routine care.  We try really hard to have available appointments Monday through Friday daytime hours for sick visits, acute visits, and physicals.  Urgent care should be used for after hours and weekends for significant issues that cannot wait till the next day.  The emergency department should be used for significant potentially life-threatening emergencies.  The emergency department is expensive, can often have long wait times for less significant concerns, so try to utilize primary care, urgent care, or telemedicine when possible to avoid unnecessary trips to the emergency department.  Virtual visits and telemedicine have been introduced since the pandemic started in 2020, and can be convenient ways to receive medical care.  We offer virtual appointments as well to assist you in a variety of options to seek medical care.   Legal  Take the time to do a last will  and testament, Advanced Directives including Health Care Power of Attorney and Living Will documents.  Don't leave your family with burdens that can be handled ahead of time.   Advanced Directives: I recommend you consider completing a Health Care Power of Attorney and Living Will.   These documents respect your wishes and help  alleviate burdens on your loved ones if you were to become terminally ill or be in a position to need those documents enforced.    You can complete Advanced Directives yourself, have them notarized, then have copies made for our office, for you and for anybody you feel should have them in safe keeping.  Or, you can have an attorney prepare these documents.   If you haven't updated your Last Will and Testament in a while, it may be worthwhile having an attorney prepare these documents together and save on some costs.       Spiritual and Emotional Health Keeping a healthy spiritual life can help you better manage your physical health. Your spiritual life can help you to cope with any issues that may arise with your physical health.  Balance can keep [place] healthy and help [place] to recover.  If you are struggling with your spiritual health there are questions that you may want to ask yourself:  What makes me feel most complete? When do I feel most connected to the rest of the world? Where do I find the most inner strength? What am I doing when I feel whole?  Helpful tips: Being in nature. Some people feel very connected and at peace when they are walking outdoors or are outside. Helping others. Some feel the largest sense of wellbeing when they are of service to others. Being of service can take on many forms. It can be doing volunteer work, being kind to strangers, or offering a hand to a friend in need. Gratitude. Some people find they feel the most connected when they remain grateful. They may make lists of all the things they are grateful for or say a thank you out loud for all they have.    Emotional Health Are you in tune with your emotional health?  Check out this link: http://www.marquez-love.com/    Financial Health Make sure you use a budget for your personal finances Make sure you are insured against risks (health insurance, life insurance, auto insurance, etc) Save more,  spend less Set financial goals If you need help in this area, good resources include counseling through [employer] or other community resources, have a meeting with a Social research officer, government, and a good resource is the [employer]

## 2022-10-21 NOTE — Progress Notes (Signed)
Subjective:   HPI  Bruce Wiley is a 47 y.o. male who presents for Chief Complaint  Patient presents with   Annual Exam    Fasting. Has concerns about unexplained weight loss.     Patient Care Team: Ltanya Bayley, Cleda Mccreedy as PCP - General (Family Medicine) Dr. Erick Blinks, GI Dr. Aquilla Hacker, ENT Dr. Marin Olp, general surgery Dentist Eye doctor   Concerns: Here for well visit, fasting.  Separated from wife 06/2022, around time of their anniversary.  Been having decreased appetite and weight loss.    Been seeing surgeon about anal fistula and there was questions raised about screening for chrons  Sometimes feels off balance   Smokes marijuana daily  Started supplement to help with ED.  Lately sildenafil not as helpful. Trouble keeping erection.  Tried some counseling, tried the [employer] number, but none of the counselors were in practice.  Drinks beer every few months   Reviewed their medical, surgical, family, social, medication, and allergy history and updated chart as appropriate.  Allergies  Allergen Reactions   Pineapple Flavor Itching    Past Medical History:  Diagnosis Date   Allergy    seasonal   Fistula 2024   anus   Genital herpes    GERD (gastroesophageal reflux disease)    Hemorrhoids    Pneumonia    Recurrent cold sores      Current Outpatient Medications:    Ashwagandha 120 MG CAPS, Take by mouth., Disp: , Rfl:    cetirizine (ZYRTEC ALLERGY) 10 MG tablet, Take 1 tablet (10 mg total) by mouth daily., Disp: 30 tablet, Rfl: 0   doxycycline (VIBRA-TABS) 100 MG tablet, Take 1 tablet (100 mg total) by mouth 2 (two) times daily., Disp: 14 tablet, Rfl: 0   fluticasone (FLONASE) 50 MCG/ACT nasal spray, Place 2 sprays into both nostrils daily., Disp: 11.1 mL, Rfl: 2   Nutritional Supplements (NUTRITIONAL SUPPLEMENT PO), Take by mouth., Disp: , Rfl:    omeprazole (PRILOSEC) 40 MG capsule, Take 40 mg by mouth daily., Disp: , Rfl:     sildenafil (VIAGRA) 50 MG tablet, TAKE ONE TABLET BY MOUTH DAILY AS NEEDED FOR ERECTILE DYSFUNCTION, Disp: 20 tablet, Rfl: 0   tetrahydrozoline-zinc (VISINE-AC) 0.05-0.25 % ophthalmic solution, Place 2 drops into both eyes 3 (three) times daily as needed (dry/irritayed eyes)., Disp: , Rfl:    valACYclovir (VALTREX) 500 MG tablet, Take 1 tablet (500 mg total) by mouth daily. Daily for suppression, use 4 tablets BID x 1 day for flare up, Disp: 100 tablet, Rfl: 3   albuterol (VENTOLIN HFA) 108 (90 Base) MCG/ACT inhaler, Inhale 1-2 puffs into the lungs every 6 (six) hours as needed for wheezing or shortness of breath. (Patient not taking: Reported on 10/21/2022), Disp: 1 each, Rfl: 0   ondansetron (ZOFRAN ODT) 4 MG disintegrating tablet, Take 1 tablet (4 mg total) by mouth every 8 (eight) hours as needed for nausea or vomiting. (Patient not taking: Reported on 10/21/2022), Disp: 30 tablet, Rfl: 1  Family History  Problem Relation Age of Onset   Breast cancer Mother    Colon cancer Mother 60   Heart disease Father 32       MI   Diabetes Father    Cancer Father 47       prostate, brain   Hypertension Father    Stroke Brother 43   Multiple sclerosis Brother    Breast cancer Paternal Aunt    Multiple sclerosis Cousin    Multiple sclerosis  Cousin    Rectal cancer Neg Hx    Stomach cancer Neg Hx     Past Surgical History:  Procedure Laterality Date   COLONOSCOPY  2013   Dr. Dulce Sellar   INCISION AND DRAINAGE ABSCESS N/A 09/29/2021   Procedure: INCISION AND DRAINAGE OF PERIANAL WOUND;  Surgeon: Andria Meuse, MD;  Location: [facility];  Service: General;  Laterality: N/A;   LACERATION REPAIR Left    left volar wrist   LIGATION OF INTERNAL FISTULA TRACT N/A 01/03/2022   Procedure: LIGATION OF INTERSPHINTERIC FISTULA TRACT;  Surgeon: Andria Meuse, MD;  Location: [facility];  Service: General;  Laterality: N/A;   LIPOMA EXCISION Left 09/29/2021   Procedure: EXCISION LIPOMA  LEFT BACK;  Surgeon: Griselda Miner, MD;  Location: [facility];  Service: General;  Laterality: Left;   PLACEMENT OF SETON N/A 09/29/2021   Procedure: PLACEMENT OF SETON;  Surgeon: Andria Meuse, MD;  Location: [facility];  Service: General;  Laterality: N/A;   Review of Systems  Constitutional:  Positive for weight loss. Negative for chills, fever and malaise/fatigue.       Decreased appetite, feel cold often  HENT:  Positive for sinus pain and sore throat. Negative for congestion, ear pain, hearing loss and tinnitus.        Sometimes uvula gets swollen  Eyes:  Negative for blurred vision, pain and redness.  Respiratory:  Negative for cough, hemoptysis and shortness of breath.   Cardiovascular:  Negative for chest pain, palpitations, orthopnea, claudication and leg swelling.  Gastrointestinal:  Positive for abdominal pain. Negative for blood in stool, constipation, diarrhea, nausea and vomiting.       Chronic abdominal discomfort  Genitourinary:  Negative for dysuria, flank pain, frequency, hematuria and urgency.  Musculoskeletal:  Negative for falls, joint pain and myalgias.  Skin:  Negative for itching and rash.  Neurological:  Negative for dizziness, tingling, speech change, weakness and headaches.       Sometimes feels off balance  Endo/Heme/Allergies:  Negative for polydipsia. Does not bruise/bleed easily.  Psychiatric/Behavioral:  Positive for depression. Negative for memory loss. The patient is not nervous/anxious and does not have insomnia.       Objective:  BP 114/72   Pulse (!) 50   Temp 98.2 F (36.8 C) (Oral)   Ht 6' 0.75" (1.848 m)   Wt 191 lb 6.4 oz (86.8 kg)   SpO2 98% Comment: room air  BMI 25.43 kg/m   Wt Readings from Last 3 Encounters:  10/21/22 191 lb 6.4 oz (86.8 kg)  01/03/22 210 lb 1.6 oz (95.3 kg)  10/20/21 208 lb 9.6 oz (94.6 kg)   General appearance: alert, no distress, WD/WN, African American male Skin: unremarkable, birth mark left lower abdomen  flat brown naproxen 2.5 cm diameter HEENT: normocephalic, conjunctiva/corneas normal, sclerae anicteric, PERRLA, EOMi, nares patent, no discharge or erythema, pharynx with some post nasal drainage Oral cavity: MMM, tongue normal, teeth in good repair Neck: supple, no lymphadenopathy, no thyromegaly, no masses, normal ROM, no bruits Chest: non tender, normal shape and expansion Heart: RRR, normal S1, S2, no murmurs Lungs: CTA bilaterally, no wheezes, rhonchi, or rales Abdomen: +bs, soft, mild left lower abdominal tenderness, otherwise non tender, non distended, no masses, no hepatomegaly, no splenomegaly, no bruits Back: non tender, normal ROM, no scoliosis Musculoskeletal: upper extremities non tender, no obvious deformity, normal ROM throughout, lower extremities non tender, no obvious deformity, normal ROM throughout Extremities: no edema, no cyanosis, no  clubbing Pulses: 2+ symmetric, upper and lower extremities, normal cap refill Neurological: alert, oriented x 3, CN2-12 intact, strength normal upper extremities and lower extremities, sensation normal throughout, DTRs 2+ throughout, no cerebellar signs, gait normal Psychiatric: normal affect, behavior normal, pleasant  GU: normal male external genitalia,circumcised, nontender, no masses, no hernia, no lymphadenopathy Rectal: deferred    Assessment and Plan :   Encounter Diagnoses  Name Primary?   Encounter for health maintenance examination in adult Yes   Benign prostatic hyperplasia with urinary hesitancy    Erectile dysfunction, unspecified erectile dysfunction type    Former smoker    Gastroesophageal reflux disease, unspecified whether esophagitis present    Vaccine counseling    Screening for prostate cancer    Screening for lipid disorders    Depressed mood    Weight loss    Screen for STD (sexually transmitted disease)    Chronic abdominal pain    Dysphagia, unspecified type     This visit was a preventative care  visit, also known as wellness visit or routine physical.   Topics typically include healthy lifestyle, diet, exercise, preventative care, vaccinations, sick and well care, proper use of emergency dept and after hours care, as well as other concerns.     Separate significant issues discussed: Weight loss, decreased appetite - discussed possible causes.  He went though separation in 06/2022 and has had some depressed mood.  Updated labs today  ED - not the best response with Viagra.  Begin trial of cilias. Discussed risks/benefit and proper use of medication  Swallow concerns, chronic abdominal pain, hx/o anal fistula - pending labs, likely referral back to Dr. Gustavo Lah  Depressed mood - advised he try counseling, gave some resources  Post nasal drainage, congestion - begin 7 day trial of doxycyline antibiotic along with salt water gargles, and continue allergy pill daily for at least the next 10 days    General Recommendations: Continue to return yearly for your annual wellness and preventative care visits.  This gives [place] a chance to discuss healthy lifestyle, exercise, vaccinations, review your chart record, and perform screenings where appropriate.  I recommend you see your eye doctor yearly for routine vision care.  I recommend you see your dentist yearly for routine dental care including hygiene visits twice yearly.   Vaccination  Immunization History  Administered Date(s) Administered   Influenza,inj,Quad PF,6+ Mos 02/17/2017   Influenza-Unspecified 02/20/2021   PFIZER(Purple Top)SARS-COV-2 Vaccination 09/26/2019, 10/24/2019   Tdap 09/03/2019    Screening for cancer: Colon cancer screening: Prior or last colon cancer screen: 2021  Testicular cancer screening You should do a monthly self testicular exam if you are between 38-37 years old, and we typically do a testicular exam on the yearly physical for this same age group.   Prostate Cancer screening: The recommended  prostate cancer screening test is a blood test called the prostate-specific antigen (PSA) test. PSA is a protein that is made in the prostate. As you age, your prostate naturally produces more PSA. Abnormally high PSA levels may be caused by: Prostate cancer. An enlarged prostate that is not caused by cancer (benign prostatic hyperplasia, or BPH). This condition is very common in older men. A prostate gland infection (prostatitis) or urinary tract infection. Certain medicines such as male hormones (like testosterone) or other medicines that raise testosterone levels. A rectal exam may be done as part of prostate cancer screening to help provide information about the size of your prostate gland. When a rectal exam  is performed, it should be done after the PSA level is drawn to avoid any effect on the results.   Skin cancer screening: Check your skin regularly for new changes, growing lesions, or other lesions of concern Come in for evaluation if you have skin lesions of concern.   Lung cancer screening: If you have a greater than 20 pack year history of tobacco use, then you may qualify for lung cancer screening with a chest CT scan.   Please call your insurance company to inquire about coverage for this test.   Pancreatic cancer:  no current screening test is available or routinely recommended. (risk factors: smoking, overweight or obese, diabetes, chronic pancreatitis, work exposure - dry cleaning, metal working, 45yo>, M>F, Tree surgeon, family hx/o, hereditary breast, ovarian, melanoma, lynch, peutz-jeghers).  Symptoms: jaundice, dark urine, light color or greasy stools, itchy skin, belly or back pain, weight loss, poor appetite, nausea, vomiting, liver enlargement, DVT/blood clots.   We currently don't have screenings for other cancers besides breast, cervical, colon, and lung cancers.  If you have a strong family history of cancer or have other cancer screening concerns, please let me  know.  Genetic testing referral is an option for individuals with high cancer risk in the family.  There are some other cancer screenings in development currently.   Bone health: Get at least 150 minutes of aerobic exercise weekly Get weight bearing exercise at least once weekly Bone density test:  A bone density test is an imaging test that uses a type of X-ray to measure the amount of calcium and other minerals in your bones. The test may be used to diagnose or screen you for a condition that causes weak or thin bones (osteoporosis), predict your risk for a broken bone (fracture), or determine how well your osteoporosis treatment is working. The bone density test is recommended for females 65 and older, or females or males <65 if certain risk factors such as thyroid disease, long term use of steroids such as for asthma or rheumatological issues, vitamin D deficiency, estrogen deficiency, family history of osteoporosis, self or family history of fragility fracture in first degree relative.    Heart health: Get at least 150 minutes of aerobic exercise weekly Limit alcohol It is important to maintain a healthy blood pressure and healthy cholesterol numbers  Heart disease screening: Screening for heart disease includes screening for blood pressure, fasting lipids, glucose/diabetes screening, BMI height to weight ratio, reviewed of smoking status, physical activity, and diet.    Goals include blood pressure 120/80 or less, maintaining a healthy lipid/cholesterol profile, preventing diabetes or keeping diabetes numbers under good control, not smoking or using tobacco products, exercising most days per week or at least 150 minutes per week of exercise, and eating healthy variety of fruits and vegetables, healthy oils, and avoiding unhealthy food choices like fried food, fast food, high sugar and high cholesterol foods.    Other tests may possibly include EKG test, CT coronary calcium score,  echocardiogram, exercise treadmill stress test.      Vascular disease screening: For higher risk individuals including smokers, diabetics, patients with known heart disease or high blood pressure, kidney disease, and others, screening for vascular disease or atherosclerosis of the arteries is available.  Examples may include carotid ultrasound, abdominal aortic ultrasound, ABI blood flow screening in the legs, thoracic aorta screening.    Medical care options: I recommend you continue to seek care here first for routine care.  We try really hard  to have available appointments Monday through Friday daytime hours for sick visits, acute visits, and physicals.  Urgent care should be used for after hours and weekends for significant issues that cannot wait till the next day.  The emergency department should be used for significant potentially life-threatening emergencies.  The emergency department is expensive, can often have long wait times for less significant concerns, so try to utilize primary care, urgent care, or telemedicine when possible to avoid unnecessary trips to the emergency department.  Virtual visits and telemedicine have been introduced since the pandemic started in 2020, and can be convenient ways to receive medical care.  We offer virtual appointments as well to assist you in a variety of options to seek medical care.   Legal  Take the time to do a last will and testament, Advanced Directives including Health Care Power of Attorney and Living Will documents.  Don't leave your family with burdens that can be handled ahead of time.   Advanced Directives: I recommend you consider completing a Health Care Power of Attorney and Living Will.   These documents respect your wishes and help alleviate burdens on your loved ones if you were to become terminally ill or be in a position to need those documents enforced.    You can complete Advanced Directives yourself, have them notarized, then  have copies made for our office, for you and for anybody you feel should have them in safe keeping.  Or, you can have an attorney prepare these documents.   If you haven't updated your Last Will and Testament in a while, it may be worthwhile having an attorney prepare these documents together and save on some costs.       Spiritual and Emotional Health Keeping a healthy spiritual life can help you better manage your physical health. Your spiritual life can help you to cope with any issues that may arise with your physical health.  Balance can keep [place] healthy and help [place] to recover.  If you are struggling with your spiritual health there are questions that you may want to ask yourself:  What makes me feel most complete? When do I feel most connected to the rest of the world? Where do I find the most inner strength? What am I doing when I feel whole?  Helpful tips: Being in nature. Some people feel very connected and at peace when they are walking outdoors or are outside. Helping others. Some feel the largest sense of wellbeing when they are of service to others. Being of service can take on many forms. It can be doing volunteer work, being kind to strangers, or offering a hand to a friend in need. Gratitude. Some people find they feel the most connected when they remain grateful. They may make lists of all the things they are grateful for or say a thank you out loud for all they have.    Emotional Health Are you in tune with your emotional health?  Check out this link: http://www.marquez-love.com/    Financial Health Make sure you use a budget for your personal finances Make sure you are insured against risks (health insurance, life insurance, auto insurance, etc) Save more, spend less Set financial goals If you need help in this area, good resources include counseling through [employer] or other community resources, have a meeting with a Social research officer, government, and a  good resource is the [employer]    Edley was seen today for annual exam.  Diagnoses  and all orders for this visit:  Encounter for health maintenance examination in adult -     Comprehensive metabolic panel -     CBC -     Lipid panel -     TSH -     POCT Urinalysis DIP (Proadvantage Device) -     PSA -     RPR+HIV+GC+CT Panel -     Hepatitis C antibody -     Hepatitis B surface antigen  Benign prostatic hyperplasia with urinary hesitancy  Erectile dysfunction, unspecified erectile dysfunction type  Former smoker  Gastroesophageal reflux disease, unspecified whether esophagitis present  Vaccine counseling  Screening for prostate cancer -     PSA  Screening for lipid disorders -     Lipid panel  Depressed mood -     TSH  Weight loss -     TSH  Screen for STD (sexually transmitted disease) -     RPR+HIV+GC+CT Panel -     Hepatitis C antibody -     Hepatitis B surface antigen  Chronic abdominal pain  Dysphagia, unspecified type  Other orders -     doxycycline (VIBRA-TABS) 100 MG tablet; Take 1 tablet (100 mg total) by mouth 2 (two) times daily.     Follow-up pending labs, yearly for physical

## 2022-10-22 LAB — COMPREHENSIVE METABOLIC PANEL
ALT: 22 IU/L (ref 0–44)
AST: 23 IU/L (ref 0–40)
Albumin/Globulin Ratio: 2 (ref 1.2–2.2)
Albumin: 4.5 g/dL (ref 4.1–5.1)
Alkaline Phosphatase: 54 IU/L (ref 44–121)
BUN/Creatinine Ratio: 12 (ref 9–20)
BUN: 11 mg/dL (ref 6–24)
Bilirubin Total: 0.4 mg/dL (ref 0.0–1.2)
CO2: 26 mmol/L (ref 20–29)
Calcium: 9.6 mg/dL (ref 8.7–10.2)
Chloride: 103 mmol/L (ref 96–106)
Creatinine, Ser: 0.91 mg/dL (ref 0.76–1.27)
Globulin, Total: 2.2 g/dL (ref 1.5–4.5)
Glucose: 86 mg/dL (ref 70–99)
Potassium: 4.2 mmol/L (ref 3.5–5.2)
Sodium: 142 mmol/L (ref 134–144)
Total Protein: 6.7 g/dL (ref 6.0–8.5)
eGFR: 107 mL/min/{1.73_m2} (ref >59)

## 2022-10-22 LAB — CBC
Hematocrit: 42.1 % (ref 37.5–51.0)
Hemoglobin: 13.5 g/dL (ref 13.0–17.7)
MCH: 27.1 pg (ref 26.6–33.0)
MCHC: 32.1 g/dL (ref 31.5–35.7)
MCV: 84 fL (ref 79–97)
Platelets: 190 10*3/uL (ref 150–450)
RBC: 4.99 x10E6/uL (ref 4.14–5.80)
RDW: 13.2 % (ref 11.6–15.4)
WBC: 3.9 10*3/uL (ref 3.4–10.8)

## 2022-10-22 LAB — TSH: TSH: 0.734 u[IU]/mL (ref 0.450–4.500)

## 2022-10-22 LAB — LIPID PANEL
Chol/HDL Ratio: 3.7 ratio (ref 0.0–5.0)
Cholesterol, Total: 180 mg/dL (ref 100–199)
HDL: 49 mg/dL (ref >39)
LDL Chol Calc (NIH): 116 mg/dL — ABNORMAL HIGH (ref 0–99)
Triglycerides: 81 mg/dL (ref 0–149)
VLDL Cholesterol Cal: 15 mg/dL (ref 5–40)

## 2022-10-22 LAB — PSA: Prostate Specific Ag, Serum: 1.4 ng/mL (ref 0.0–4.0)

## 2022-10-23 ENCOUNTER — Other Ambulatory Visit: Payer: Self-pay | Admitting: Medical

## 2022-10-23 MED ORDER — VALACYCLOVIR HCL 500 MG PO TABS: 500.0000 mg | ORAL_TABLET | Freq: Every day | ORAL | 3 refills | Status: DC

## 2022-10-23 MED ORDER — TADALAFIL 20 MG PO TABS: 20.0000 mg | ORAL_TABLET | Freq: Every day | ORAL | 2 refills | Status: DC | PRN

## 2022-10-23 MED ORDER — ALBUTEROL SULFATE HFA 108 (90 BASE) MCG/ACT IN AERS
1.0000 | INHALATION_SPRAY | Freq: Four times a day (QID) | RESPIRATORY_TRACT | 0 refills | Status: AC | PRN
Start: 2022-10-23 — End: ?

## 2022-10-23 NOTE — Progress Notes (Signed)
Results sent through MyChart

## 2022-10-25 LAB — HEPATITIS B SURFACE ANTIGEN: Hepatitis B Surface Ag: NEGATIVE

## 2022-10-25 LAB — RPR+HIV+GC+CT PANEL
Chlamydia trachomatis, NAA: NEGATIVE
HIV Screen 4th Generation wRfx: NONREACTIVE
Neisseria Gonorrhoeae by PCR: NEGATIVE
RPR Ser Ql: NONREACTIVE

## 2022-10-25 LAB — HEPATITIS C ANTIBODY: Hep C Virus Ab: NONREACTIVE

## 2022-10-25 NOTE — Progress Notes (Signed)
Results sent through MyChart

## 2022-11-03 ENCOUNTER — Other Ambulatory Visit: Payer: Self-pay | Admitting: Medical

## 2022-12-07 ENCOUNTER — Other Ambulatory Visit: Payer: Self-pay | Admitting: Medical

## 2022-12-07 MED ORDER — SILDENAFIL CITRATE 100 MG PO TABS: 100.0000 mg | ORAL_TABLET | ORAL | 2 refills | Status: AC | PRN

## 2023-02-11 IMAGING — CR DG CHEST 2V
2 series · 2 of 2 positions shown · non-contrast
Comparison: February 25, 2018

CLINICAL DATA: Shortness of breath.

EXAM:
CHEST - 2 VIEW

[w chest pa]
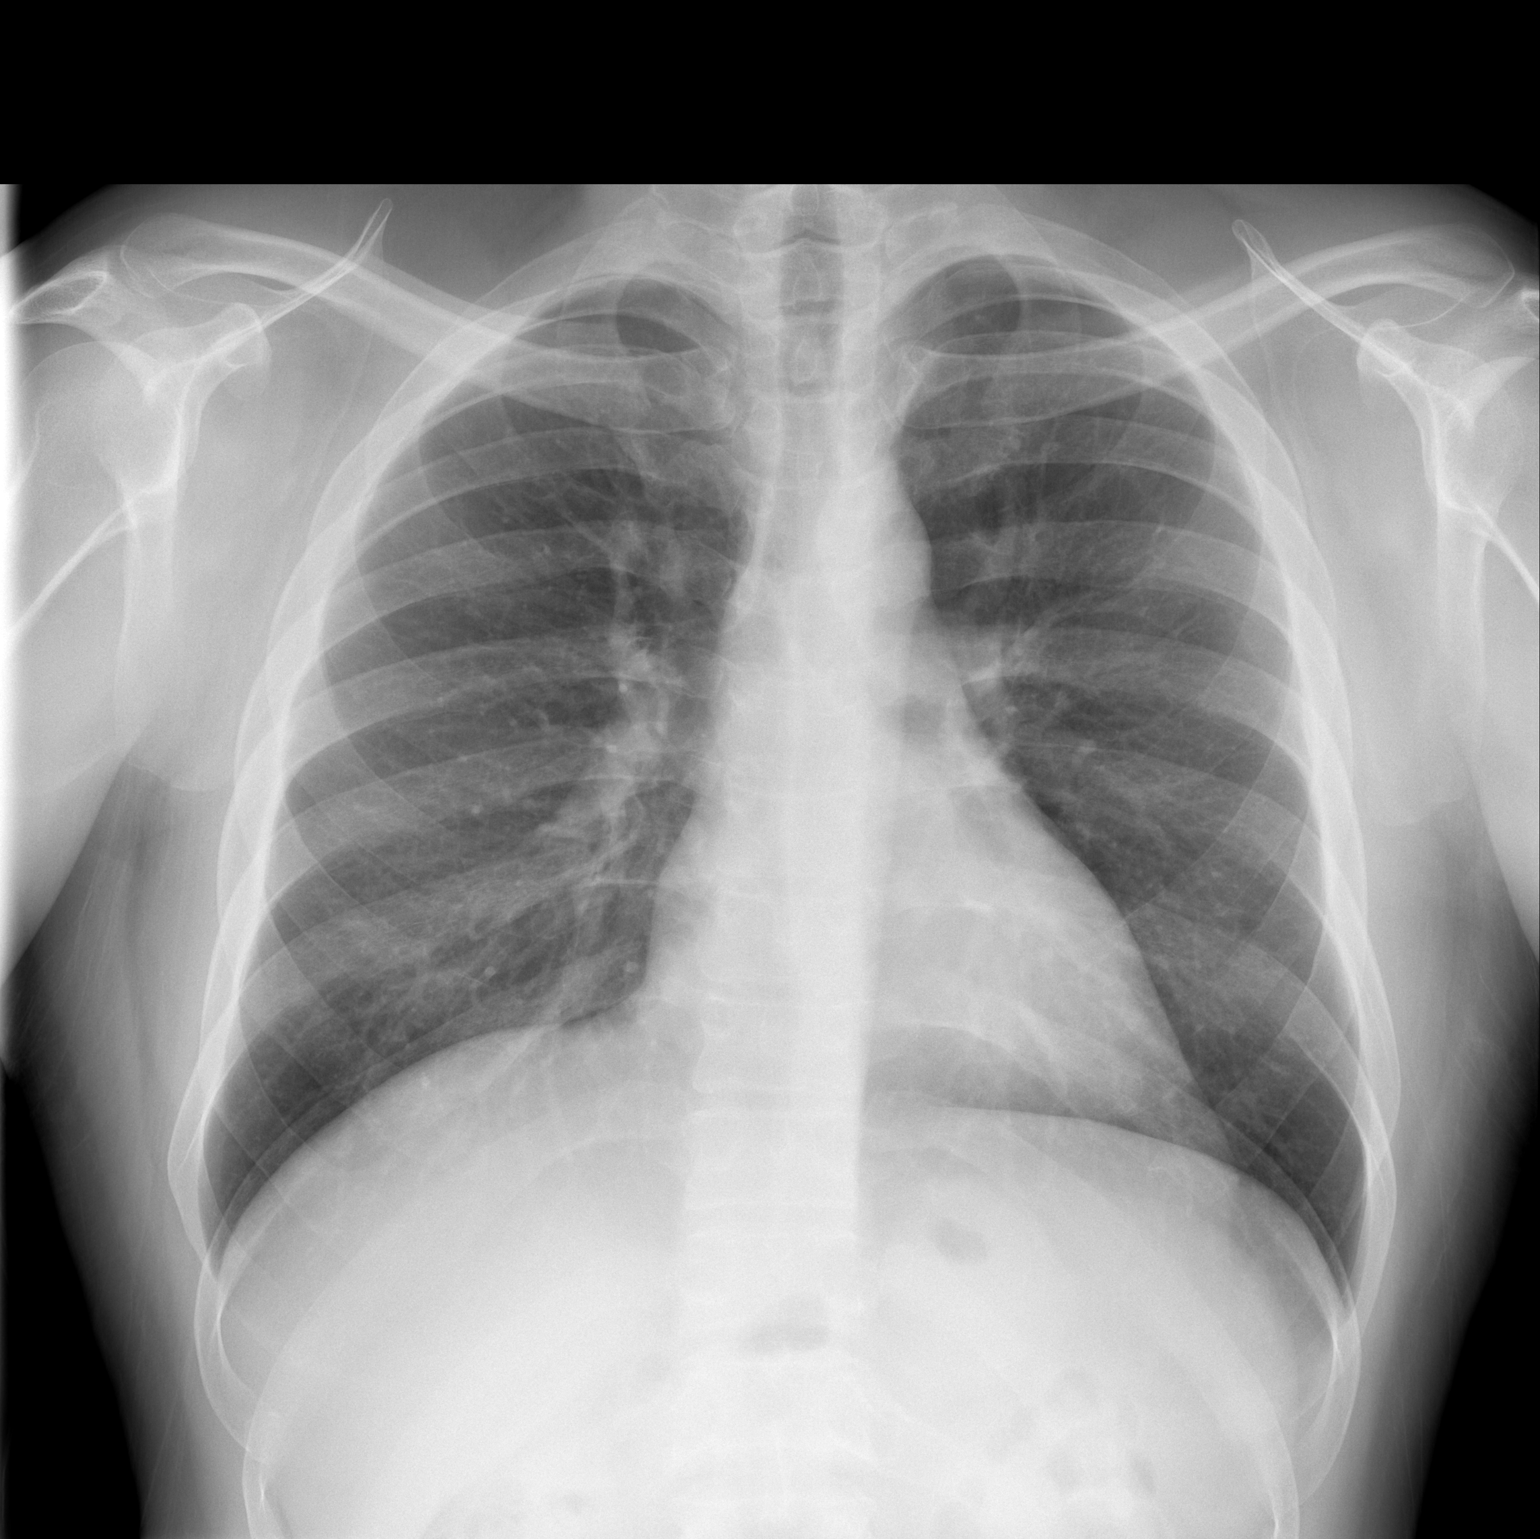

[w chest lat]
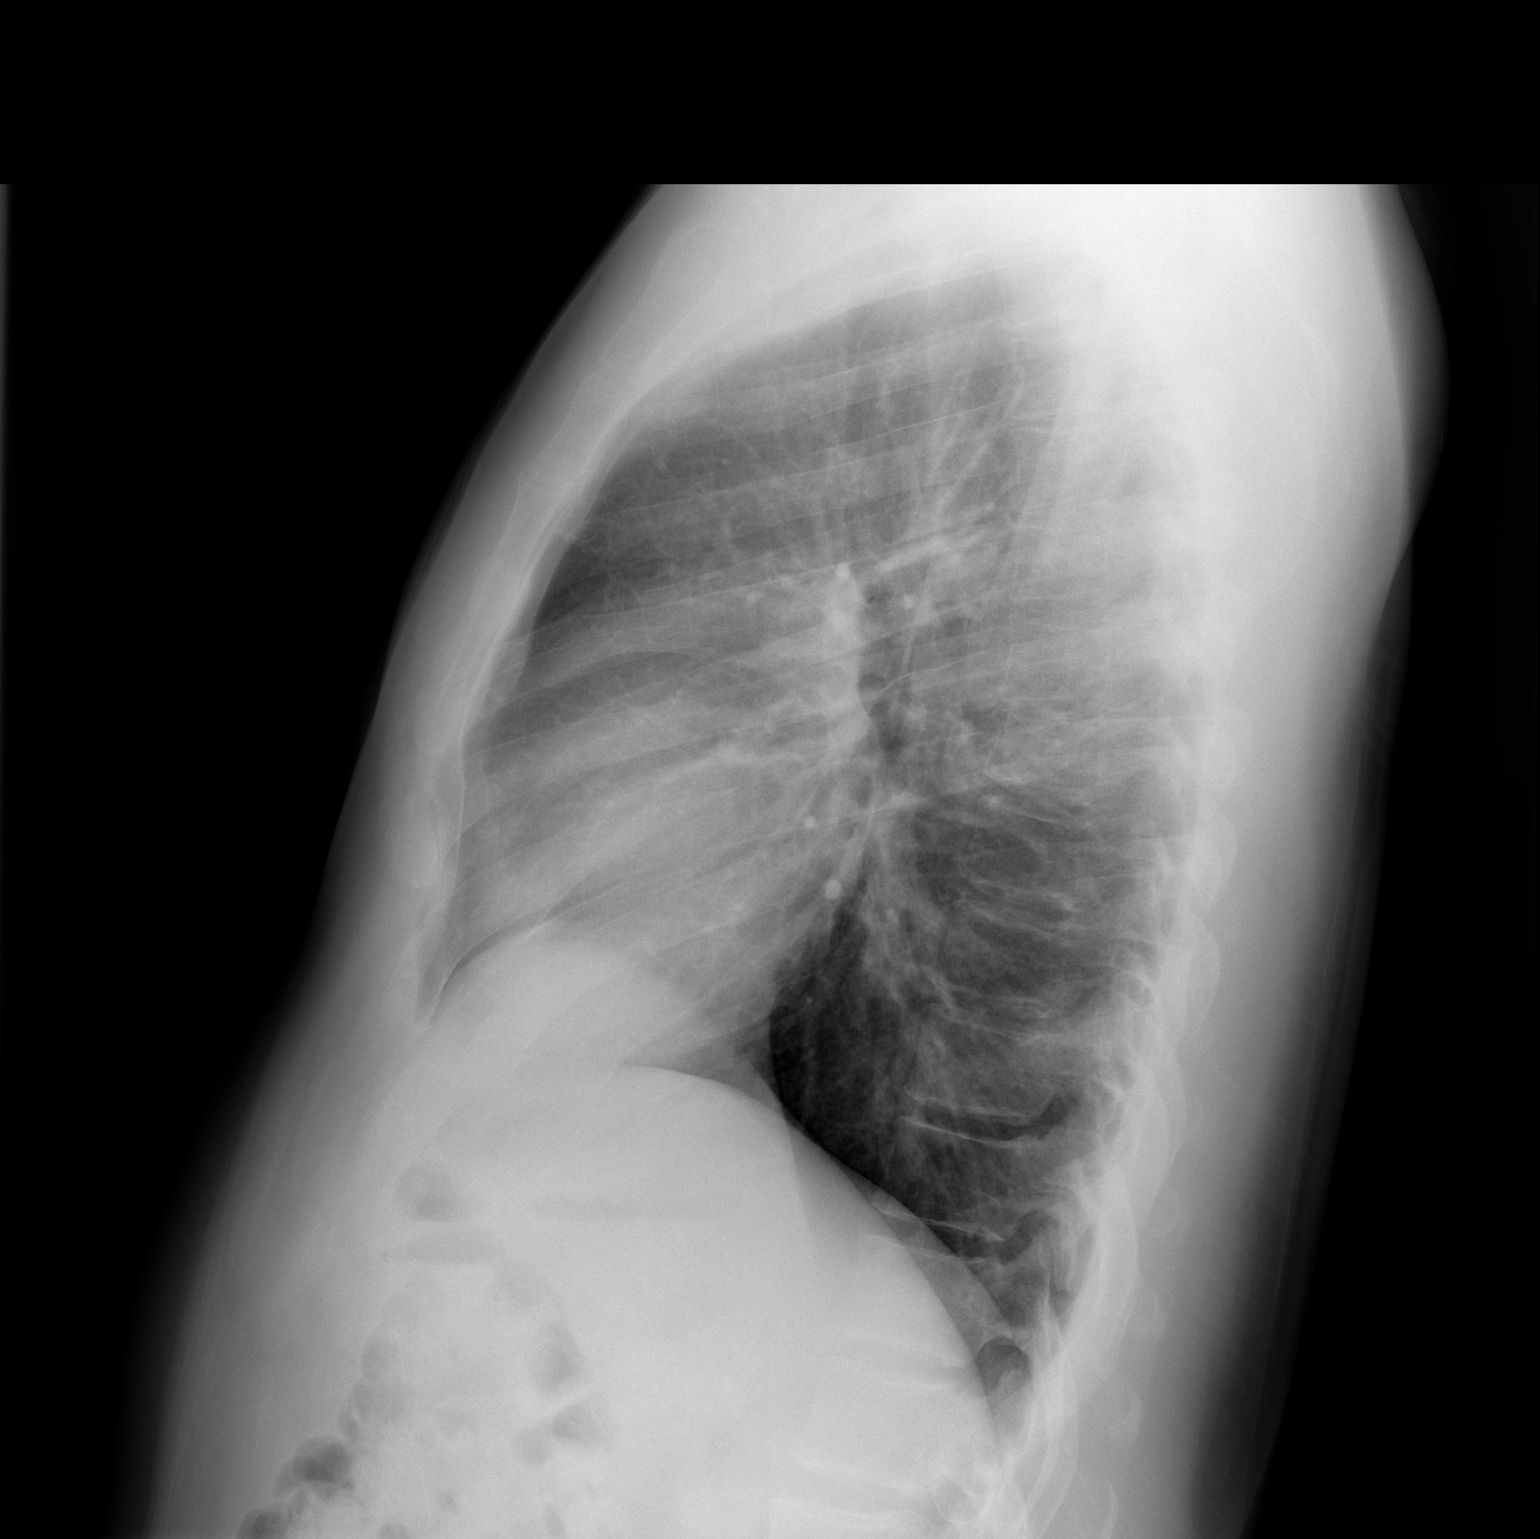

[2 of 2 positions shown; findings below may reference images not displayed]

FINDINGS: The heart size and mediastinal contours are within normal limits.
Both lungs are clear. The visualized skeletal structures are
unremarkable.
IMPRESSION: No active cardiopulmonary disease.

## 2023-10-30 DIAGNOSIS — N5201 Erectile dysfunction due to arterial insufficiency: Secondary | ICD-10-CM | POA: Diagnosis not present

## 2023-10-30 DIAGNOSIS — E291 Testicular hypofunction: Secondary | ICD-10-CM | POA: Diagnosis not present

## 2024-03-29 ENCOUNTER — Other Ambulatory Visit: Payer: Self-pay | Admitting: Medical

## 2024-05-02 ENCOUNTER — Encounter (HOSPITAL_COMMUNITY): Payer: Self-pay

## 2024-05-02 ENCOUNTER — Ambulatory Visit (HOSPITAL_COMMUNITY)
Admission: EM | Admit: 2024-05-02 | Discharge: 2024-05-02 | Disposition: A | Discharge status: Home / Self Care | Attending: Emergency Medicine | Admitting: Emergency Medicine

## 2024-05-02 DIAGNOSIS — R300 Dysuria: Secondary | ICD-10-CM | POA: Insufficient documentation

## 2024-05-02 DIAGNOSIS — R112 Nausea with vomiting, unspecified: Secondary | ICD-10-CM | POA: Diagnosis present

## 2024-05-02 DIAGNOSIS — R519 Headache, unspecified: Secondary | ICD-10-CM | POA: Diagnosis present

## 2024-05-02 LAB — POCT URINE DIPSTICK
Bilirubin, UA: NEGATIVE
Blood, UA: NEGATIVE
Glucose, UA: NEGATIVE mg/dL
Leukocytes, UA: NEGATIVE
Nitrite, UA: NEGATIVE
Protein Ur, POC: NEGATIVE mg/dL
Spec Grav, UA: 1.02 (ref 1.010–1.025)
Urobilinogen, UA: 0.2 U/dL
pH, UA: 7 (ref 5.0–8.0)

## 2024-05-02 MED ORDER — ACETAMINOPHEN 325 MG PO TABS
ORAL_TABLET | ORAL | Status: AC
  Filled 2024-05-02: qty 3

## 2024-05-02 MED ORDER — ONDANSETRON 4 MG PO TBDP: 4.0000 mg | ORAL_TABLET | Freq: Three times a day (TID) | ORAL | 0 refills | Status: AC | PRN

## 2024-05-02 MED ORDER — ONDANSETRON 4 MG PO TBDP
ORAL_TABLET | ORAL | Status: AC
  Filled 2024-05-02: qty 1

## 2024-05-02 MED ORDER — ACETAMINOPHEN 325 MG PO TABS
975.0000 mg | ORAL_TABLET | Freq: Once | ORAL | Status: AC
  Administered 2024-05-02: 975 mg via ORAL

## 2024-05-02 MED ORDER — ONDANSETRON 4 MG PO TBDP
4.0000 mg | ORAL_TABLET | Freq: Once | ORAL | Status: AC
  Administered 2024-05-02: 4 mg via ORAL

## 2024-05-02 NOTE — ED Provider Notes (Signed)
 [facility]    CSN: [id number] Arrival date & time: 05/02/24  1911      History   Chief Complaint Chief Complaint  Patient presents with   Headache    HPI Franchot Pollitt is a 48 y.o. male.   Patient presents with headache that began upon waking this morning.  Patient states that his headache was a 10 out of 10 earlier today and he did take some ibuprofen earlier in the day and eventually the headache had subsided some and now rates it at a 4 out of 10, but states that it has been persistent all day.    Patient states he did have 1 episode of vomiting this morning when his headache was at its worst. Patient states that he has had some continued nausea but has not vomited since.  Patient reports that he has also had some intermittent bilateral blurred vision throughout the day as well.  Denies fever, body aches, chills, cough, congestion, sore throat, diarrhea, and abdominal pain.  Patient states that he has also been dealing with some on and off frequent urination over the last 2 months.  Patient states that today when he went to urinate earlier he did have some pain with urinating.  Denies hematuria, flank pain, fever, and weakness.  Patient reports that he is sexually active, but denies any known exposures to STDs.  The history is provided by the patient and medical records.  Headache   Past Medical History:  Diagnosis Date   Allergy    seasonal   Fistula 2024   anus   Genital herpes    GERD (gastroesophageal reflux disease)    Hemorrhoids    Pneumonia    Recurrent cold sores     Patient Active Problem List   Diagnosis Date Noted   Encounter for health maintenance examination in adult 10/21/2022   Screening for prostate cancer 10/21/2022   Screening for lipid disorders 10/21/2022   Dysphagia 10/21/2022   Weight loss 10/21/2022   Depressed mood 10/21/2022   Lipoma of torso 09/14/2020   Benign prostatic hyperplasia with urinary hesitancy 09/14/2020    Screening for heart disease 09/14/2020   Skin lesion of back 07/14/2020   Former smoker 07/14/2020   Screen for STD (sexually transmitted disease) 09/03/2019   Vaccine counseling 09/03/2019   Ataxia 09/03/2019   Skin lesion 09/03/2019   Genital herpes simplex 07/31/2019   Family history of prostate cancer in father 07/31/2019   Family history of colon cancer in mother 07/31/2019   Lumbar back pain 05/13/2019   Recurrent cold sores 12/12/2017   Chronic abdominal pain 08/07/2017   Globus sensation 08/07/2017   Gastroesophageal reflux disease 08/07/2017   Hemorrhoids 08/07/2017   Erectile dysfunction 08/07/2017   Irritable bowel syndrome with both constipation and diarrhea 08/07/2017    Past Surgical History:  Procedure Laterality Date   COLONOSCOPY  2013   Dr. Burnette   INCISION AND DRAINAGE ABSCESS N/A 09/29/2021   Procedure: INCISION AND DRAINAGE OF PERIANAL WOUND;  Surgeon: Teresa Lonni HERO, MD;  Location: [facility];  Service: General;  Laterality: N/A;   LACERATION REPAIR Left    left volar wrist   LIGATION OF INTERNAL FISTULA TRACT N/A 01/03/2022   Procedure: LIGATION OF INTERSPHINTERIC FISTULA TRACT;  Surgeon: Teresa Lonni HERO, MD;  Location: [facility] ;  Service: General;  Laterality: N/A;   LIPOMA EXCISION Left 09/29/2021   Procedure: EXCISION LIPOMA LEFT BACK;  Surgeon: Curvin Deward MOULD, MD;  Location: [facility];  Service: General;  Laterality: Left;   PLACEMENT OF SETON N/A 09/29/2021   Procedure: PLACEMENT OF SETON;  Surgeon: Teresa Lonni HERO, MD;  Location: [facility];  Service: General;  Laterality: N/A;       Home Medications    Prior to Admission medications  Medication Sig Start Date End Date Taking? Authorizing Provider  ondansetron  (ZOFRAN -ODT) 4 MG disintegrating tablet Take 1 tablet (4 mg total) by mouth every 8 (eight) hours as needed for nausea or vomiting. 05/02/24  Yes Johnie Flaming A, NP  albuterol  (VENTOLIN  HFA) 108 (90 Base) MCG/ACT  inhaler Inhale 1-2 puffs into the lungs every 6 (six) hours as needed for wheezing or shortness of breath. 10/23/22   Tysinger, Alm RAMAN, PA-C  Ashwagandha 120 MG CAPS Take by mouth.    [provider]  cetirizine  (ZYRTEC  ALLERGY) 10 MG tablet Take 1 tablet (10 mg total) by mouth daily. 07/23/22   Dreama, [place]  N, FNP  fluticasone  (FLONASE ) 50 MCG/ACT nasal spray Place 2 sprays into both nostrils daily. 07/23/22 10/21/22  Dreama, [place]  N, FNP  Nutritional Supplements (NUTRITIONAL SUPPLEMENT PO) Take by mouth.    [provider]  omeprazole  (PRILOSEC) 40 MG capsule Take 40 mg by mouth daily. 04/01/22   [provider]  sildenafil  (VIAGRA ) 100 MG tablet Take 1 tablet (100 mg total) by mouth as needed for erectile dysfunction. 12/07/22 12/07/23  Tysinger, Alm RAMAN, PA-C  valACYclovir  (VALTREX ) 500 MG tablet TAKE 1 TABLET BY MOUTH DAILY FOR SUPPRESSION - TAKE FOUR TABLETS BY MOUTH TWICE A DAY FOR 1 DAY FOR FLARE UP 04/01/24   Tysinger, Alm RAMAN, PA-C    Family History Family History  Problem Relation Age of Onset   Breast cancer Mother    Colon cancer Mother 8   Heart disease Father 79       MI   Diabetes Father    Cancer Father 11       prostate, brain   Hypertension Father    Stroke Brother 44   Multiple sclerosis Brother    Breast cancer Paternal Aunt    Multiple sclerosis Cousin    Multiple sclerosis Cousin    Rectal cancer Neg Hx    Stomach cancer Neg Hx     Social History Social History[1]   Allergies   Pineapple flavoring agent (non-screening)   Review of Systems Review of Systems  Neurological:  Positive for headaches.   Per HPI  Physical Exam Triage Vital Signs ED Triage Vitals  Encounter Vitals Group     BP 05/02/24 1948 122/75     Girls Systolic BP Percentile --      Girls Diastolic BP Percentile --      Boys Systolic BP Percentile --      Boys Diastolic BP Percentile --      Pulse Rate 05/02/24 1948 (!) 54     Resp 05/02/24 1948 16      Temp 05/02/24 1948 98.9 F (37.2 C)     Temp Source 05/02/24 1948 Oral     SpO2 05/02/24 1948 98 %     Weight --      Height --      Head Circumference --      Peak Flow --      Pain Score 05/02/24 1947 4     Pain Loc --      Pain Education --      Exclude from Growth Chart --    No data found.  Updated Vital Signs BP 122/75 (BP Location: Left Arm)   Pulse (!) 54   Temp 98.9 F (37.2 C) (Oral)   Resp 16   SpO2 98%   Visual Acuity Right Eye Distance:   Left Eye Distance:   Bilateral Distance:    Right Eye Near:   Left Eye Near:    Bilateral Near:     Physical Exam Vitals and nursing note reviewed.  Constitutional:      General: He is awake. He is not in acute distress.    Appearance: Normal appearance. He is well-developed and well-groomed. He is not ill-appearing.  HENT:     Head: Normocephalic.     Right Ear: Tympanic membrane, ear canal and external ear normal.     Left Ear: Tympanic membrane, ear canal and external ear normal.     Nose: Nose normal.     Mouth/Throat:     Mouth: Mucous membranes are moist.     Pharynx: Oropharynx is clear.  Eyes:     Extraocular Movements: Extraocular movements intact.     Conjunctiva/sclera: Conjunctivae normal.     Pupils: Pupils are equal, round, and reactive to light.  Cardiovascular:     Rate and Rhythm: Normal rate and regular rhythm.  Pulmonary:     Effort: Pulmonary effort is normal.     Breath sounds: Normal breath sounds.  Abdominal:     General: Abdomen is flat. Bowel sounds are normal. There is no distension.     Palpations: Abdomen is soft. There is no mass.     Tenderness: There is no abdominal tenderness. There is no right CVA tenderness, left CVA tenderness, guarding or rebound.     Hernia: No hernia is present.  Musculoskeletal:     Cervical back: Normal range of motion and neck supple.  Skin:    General: Skin is warm and dry.  Neurological:     General: No focal deficit present.     Mental  Status: He is alert and oriented to person, place, and time. Mental status is at baseline.     GCS: GCS eye subscore is 4. GCS verbal subscore is 5. GCS motor subscore is 6.     Cranial Nerves: Cranial nerves 2-12 are intact.     Sensory: Sensation is intact.     Motor: Motor function is intact.     Coordination: Coordination is intact.     Gait: Gait is intact.  Psychiatric:        Behavior: Behavior is cooperative.      UC Treatments / Results  Labs (all labs ordered are listed, but only abnormal results are displayed) Labs Reviewed  POCT URINE DIPSTICK - Abnormal; Notable for the following components:      Result Value   Ketones, POC UA small (15) (*)    All other components within normal limits  URINE CULTURE  CYTOLOGY, (ORAL, ANAL, URETHRAL) ANCILLARY ONLY    EKG   Radiology No results found.  Procedures Procedures (including critical care time)  Medications Ordered in UC Medications  acetaminophen  (TYLENOL ) tablet 975 mg (has no administration in time range)  ondansetron  (ZOFRAN -ODT) disintegrating tablet 4 mg (has no administration in time range)    Initial Impression / Assessment and Plan / UC Course  I have reviewed the triage vital signs and the nursing notes.  Pertinent labs & imaging results that were available during my care of the patient were reviewed by me and considered in my medical decision making (see  chart for details).     Patient is overall well-appearing.  Vitals are stable.  No significant findings noted on exam.  GCS 15.  No neurodeficits noted.  Urinalysis unremarkable, will send urine culture to confirm that there is no presence of urinary tract infection due to presence of dysuria.  Also had patient perform self swab for STD due to presence of dysuria.  Given Tylenol  in clinic for continued headache and ODT Zofran  for acute nausea.  Prescribed additional Zofran  as needed for nausea and vomiting.  Recommended alternating between Tylenol   and ibuprofen as needed for headache.  Discussed importance of hydration.  Discussed follow-up and return precautions. Final Clinical Impressions(s) / UC Diagnoses   Final diagnoses:  Dysuria  Bad headache  Nausea and vomiting, unspecified vomiting type     Discharge Instructions      Alternate between 650 mg of Tylenol  and 400 to 600 mg of ibuprofen every 6-8 hours as needed for headache if it continues. I prescribed Zofran  that you can take every 8 hours as needed for nausea and vomiting. Make sure you are staying hydrated and getting plenty of rest as this could be contributing to your symptoms.  Your results will return over the next few days and someone will call if results require any additional treatment. Follow-up with your primary care provider or return here as needed.     ED Prescriptions     Medication Sig Dispense Auth. Provider   ondansetron  (ZOFRAN -ODT) 4 MG disintegrating tablet Take 1 tablet (4 mg total) by mouth every 8 (eight) hours as needed for nausea or vomiting. 10 tablet Johnie Flaming A, NP      PDMP not reviewed this encounter.    [1]  Social History Tobacco Use   Smoking status: Former    Current packs/day: 0.00    Types: Cigars, Cigarettes    Quit date: 09/18/2019    Years since quitting: 4.6   Smokeless tobacco: Never  Vaping Use   Vaping status: Every Day  Substance Use Topics   Alcohol use: Not Currently    Comment: Occasionally   Drug use: Yes    Types: Marijuana    Comment: Daily     Johnie Flaming LABOR, NP 05/02/24 2030

## 2024-05-02 NOTE — Discharge Instructions (Addendum)
 Alternate between 650 mg of Tylenol  and 400 to 600 mg of ibuprofen every 6-8 hours as needed for headache if it continues. I prescribed Zofran  that you can take every 8 hours as needed for nausea and vomiting. Make sure you are staying hydrated and getting plenty of rest as this could be contributing to your symptoms.  Your results will return over the next few days and someone will call if results require any additional treatment. Follow-up with your primary care provider or return here as needed.

## 2024-05-02 NOTE — ED Triage Notes (Signed)
 Patient here today with c/o nausea and vomiting once today. Patient has been having a headache all day. Patient has taken Ibuprofen with no relief.

## 2024-05-03 ENCOUNTER — Ambulatory Visit (HOSPITAL_COMMUNITY): Payer: Self-pay

## 2024-05-03 LAB — URINE CULTURE: Culture: NO GROWTH

## 2024-05-03 LAB — CYTOLOGY, (ORAL, ANAL, URETHRAL) ANCILLARY ONLY
Chlamydia: NEGATIVE
Comment: NEGATIVE
Comment: NEGATIVE
Comment: NORMAL
Neisseria Gonorrhea: NEGATIVE
Trichomonas: NEGATIVE
